# Patient Record
Sex: Male | Born: 1950 | Race: Black or African American | Hispanic: No | Marital: Married | Smoking: Never smoker
Health system: Southern US, Community
[De-identification: ages and names within clinical notes are randomized; demographics above are authoritative.]

## PROBLEM LIST (undated history)

## (undated) DIAGNOSIS — I10 Essential (primary) hypertension: Secondary | ICD-10-CM

## (undated) DIAGNOSIS — I499 Cardiac arrhythmia, unspecified: Secondary | ICD-10-CM

## (undated) DIAGNOSIS — E7800 Pure hypercholesterolemia, unspecified: Secondary | ICD-10-CM

## (undated) DIAGNOSIS — E119 Type 2 diabetes mellitus without complications: Secondary | ICD-10-CM

## (undated) HISTORY — PX: OTHER SURGICAL HISTORY: SHX169

---

## 2011-08-15 ENCOUNTER — Ambulatory Visit: Payer: Self-pay | Admitting: Internal Medicine

## 2013-05-30 ENCOUNTER — Ambulatory Visit: Payer: Self-pay | Admitting: Unknown Physician Specialty

## 2013-07-28 ENCOUNTER — Ambulatory Visit: Payer: Self-pay | Admitting: Unknown Physician Specialty

## 2013-07-28 LAB — BASIC METABOLIC PANEL
Anion Gap: 5 — ABNORMAL LOW (ref 7–16)
BUN: 17 mg/dL (ref 7–18)
Calcium, Total: 9.2 mg/dL (ref 8.5–10.1)
Chloride: 106 mmol/L (ref 98–107)
Co2: 28 mmol/L (ref 21–32)
Creatinine: 1.11 mg/dL (ref 0.60–1.30)
EGFR (Non-African Amer.): >60
Glucose: 99 mg/dL (ref 65–99)
Osmolality: 279 (ref 275–301)
POTASSIUM: 4 mmol/L (ref 3.5–5.1)
SODIUM: 139 mmol/L (ref 136–145)

## 2013-08-12 ENCOUNTER — Ambulatory Visit: Payer: Self-pay | Admitting: Unknown Physician Specialty

## 2013-08-13 LAB — PATHOLOGY REPORT

## 2014-09-05 NOTE — Op Note (Signed)
PATIENT NAME:  Jordan Santana, Jordan Santana MR#:  [id number] DATE OF BIRTH:  [birth year]  DATE OF PROCEDURE:  08/12/2013  DATE OF DICTATION: 08/22/2013   PREOPERATIVE DIAGNOSIS: Right tongue mass.  POSTOPERATIVE DIAGNOSIS: Right tongue mass.  ATTENDING SURGEON: Roena Malady, MD  OPERATION PERFORMED: Excision of intra-tongue mass.   OPERATIVE FINDINGS: Approximately 1.5 x 2 cm firm mass within just right midline of the tongue.   DESCRIPTION OF PROCEDURE: [name] was identified in the holding area, taken to the operating room and placed in the supine position. After general nasotracheal anesthesia, the table was turned 90 degrees, and the patient was draped in the usual fashion for oral surgery. A Molt retractor was then placed between the teeth, and a stitch was placed in the tongue for retraction. There was a firm mass just to the right of midline within the tongue. A local anesthetic of 2% Marcaine with 1:200,000 units of epinephrine was used to inject the inferior aspect of the tongue. A total of 3 mL was used. With the tongue retracted laterally, incision was made just to the right of midline on the ventral surface of the tongue. Dissection using the microbipolars was carried down to the mass, and using blunt dissection and the microbipolar, this mass was removed in its entirety with a small cuff of muscle around this as well. With the mass removed in its entirety, any bleeding points were cauterized using the microbipolar. The mucosal layers were then reapproximated using 4-0 Vicryl suture. The patient was then returned to anesthesia, where he was extubated in the operating room and taken to the recovery room in stable condition.   CULTURES: None.   SPECIMENS: Tongue mass.   ESTIMATED BLOOD LOSS: Less than 5 mL.    ____________________________ Roena Malady, MD ctm:lb D: 08/22/2013 09:19:39 ET T: 08/22/2013 09:26:16 ET JOB#: [id number]  cc: Roena Malady, MD, <Dictator> Roena Malady  MD ELECTRONICALLY SIGNED 09/10/2013 8:23

## 2015-03-30 IMAGING — CT CT NECK WITH CONTRAST
4 of 5 series · 15 of 33 positions shown, 17 images · IV contrast (agent unspecified)
Comparison: None.

CLINICAL DATA: Right anterior tongue mass/anterior neck mass for 3
weeks.

EXAM:
CT NECK WITH CONTRAST
TECHNIQUE: Multidetector CT imaging of the neck was performed using the
standard protocol following the bolus administration of intravenous
contrast.
CONTRAST:  75 mL Hsovue-HMM

[Series 2: axial neck · axial · 0.49mm/px · z∈[-135,-23]mm · 3 of 112 slices shown]
[im 28/112  bone]
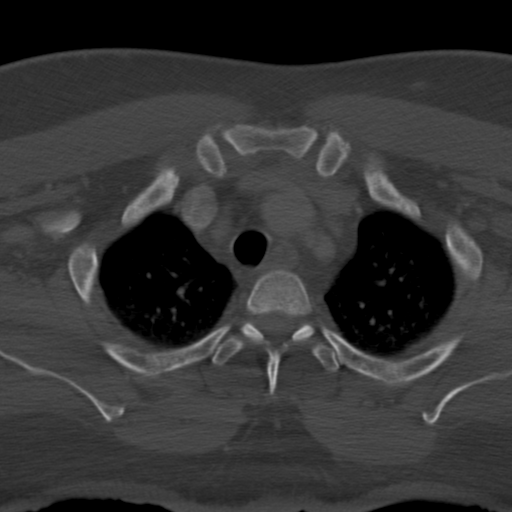
[im 56/112  bone]
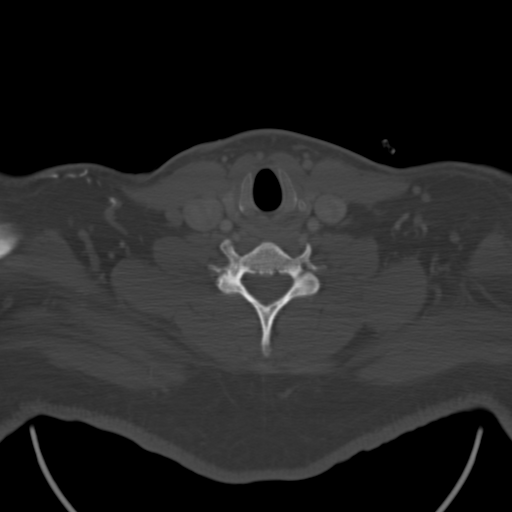
[im 84/112  bone]
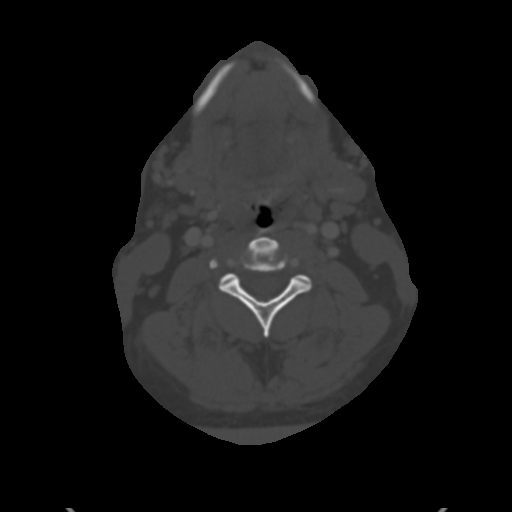

[Series 4: sag neck · sagittal · 0.45mm/px · 5 of 126 slices shown, 6 images]
[im 42/126  bone]
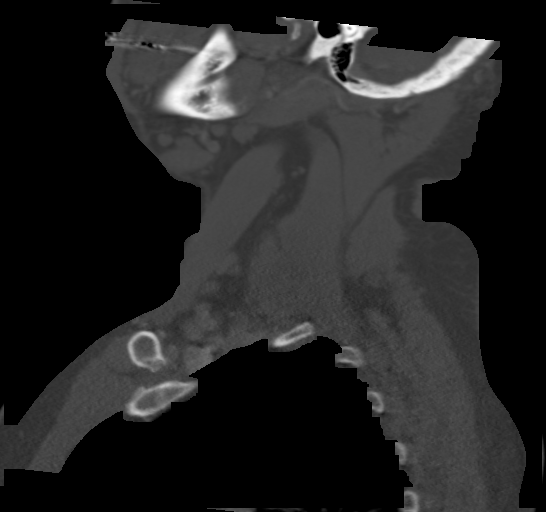
[im 53/126  bone]
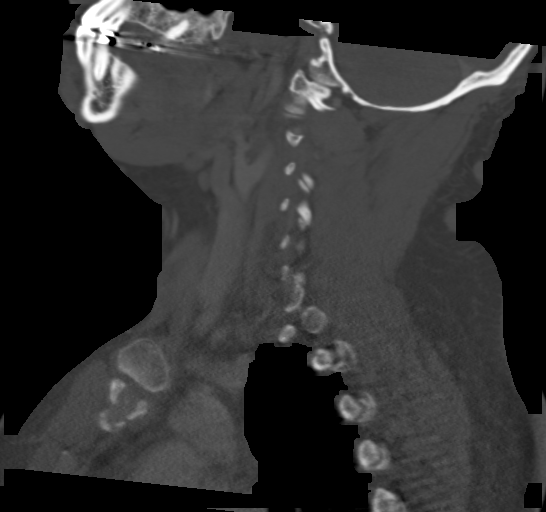
[im 63/126  soft-tissue]
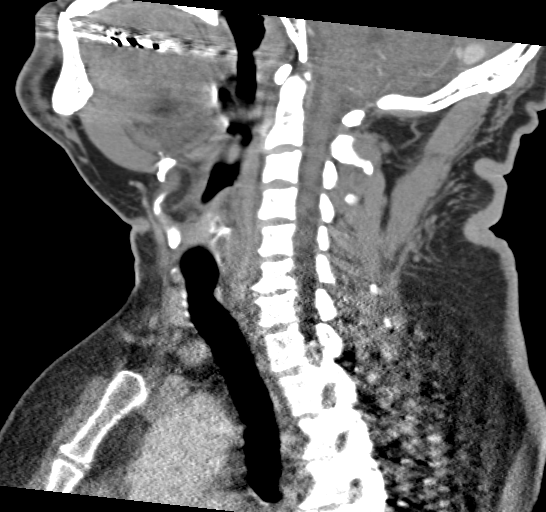
[im 63/126  bone]
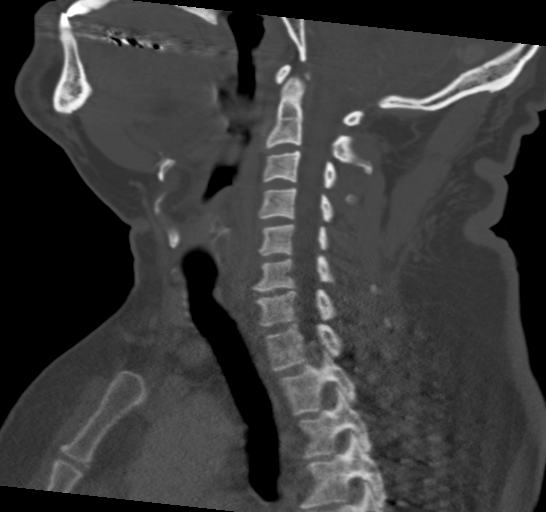
[im 73/126  bone]
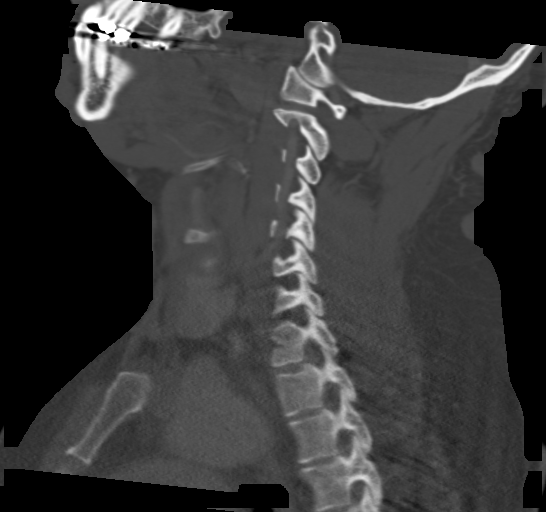
[im 84/126  bone]
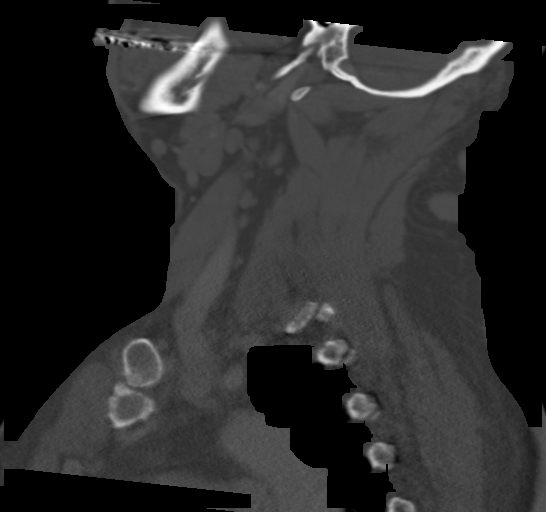

[Series 5: cor neck · coronal · 0.46mm/px · 3 of 110 slices shown]
[im 22/110  bone]
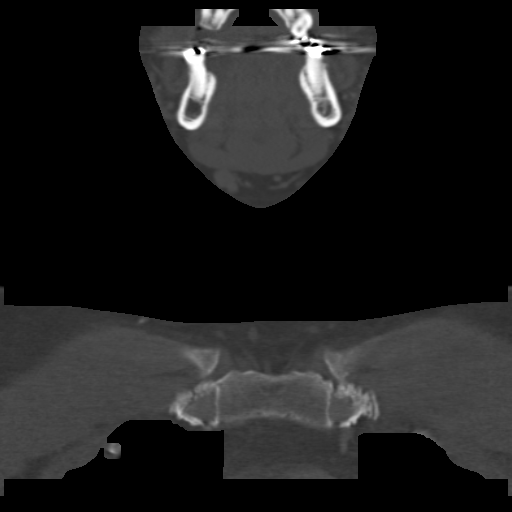
[im 44/110  bone]
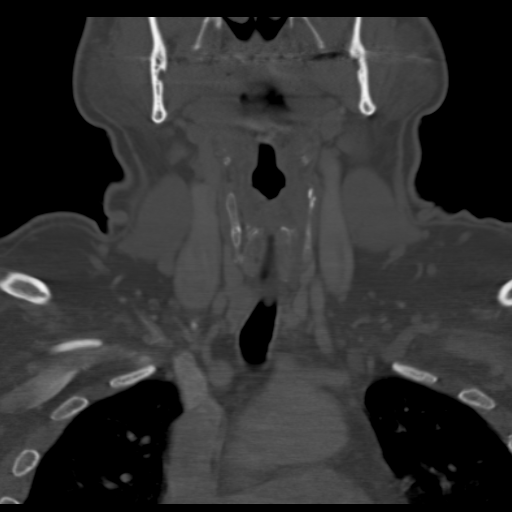
[im 66/110  bone]
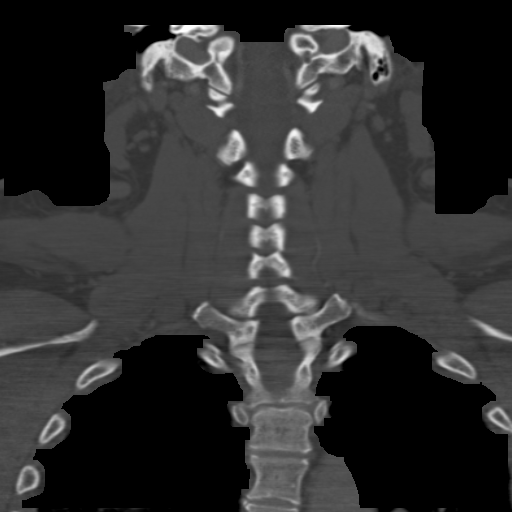

[Series 6: ax oropharynx · axial · 0.50mm/px · z∈[-162,-25]mm · 4 of 116 slices shown, 5 images]
[im 24/116  soft-tissue]
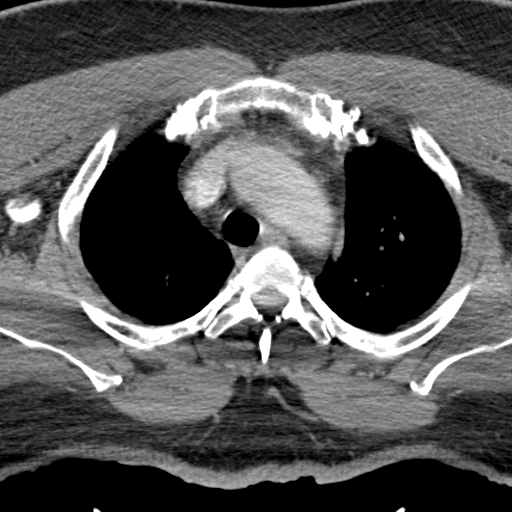
[im 24/116  bone]
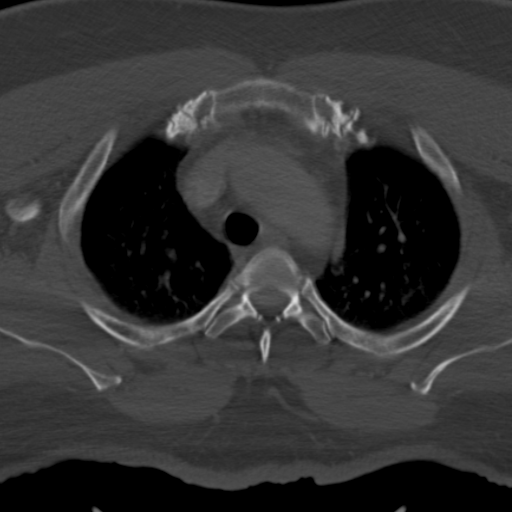
[im 47/116  bone]
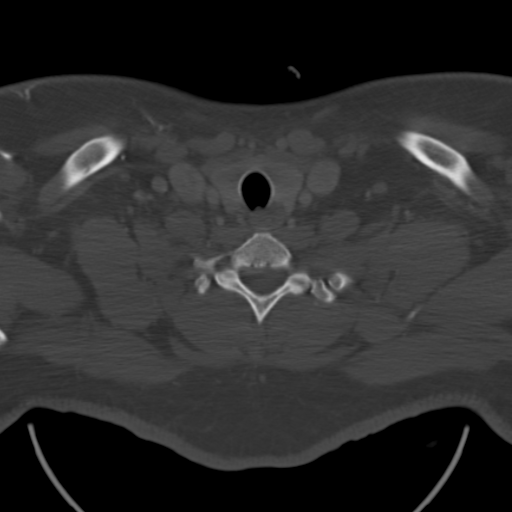
[im 70/116  bone]
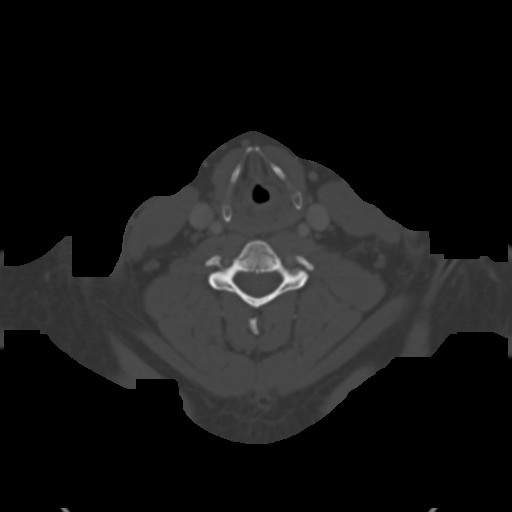
[im 93/116  bone]
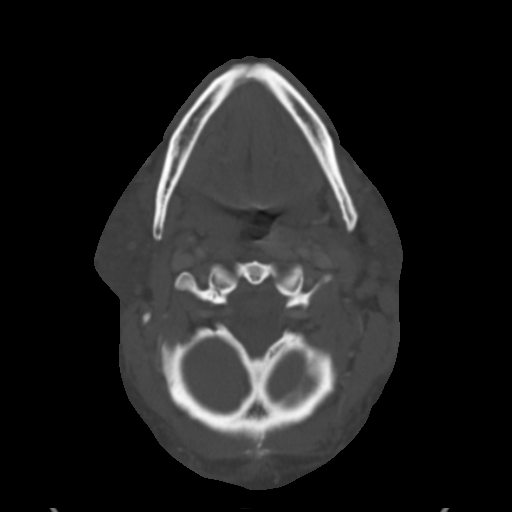

[15 of 33 positions shown; findings below may reference images not displayed]

FINDINGS: The visualized portion of the brain is unremarkable. Visualized
paranasal sinuses and mastoid air cells are clear.

Streak artifact is present from dental amalgam. The nasopharynx,
oral cavity, oropharynx, and larynx are unremarkable. The parotid
and submandibular glands are unremarkable. Thyroid is unremarkable.

Deep to a marker placed on the anterior right neck in the area of
clinical concern is a mildly enlarged right submental lymph node
measuring 1.6 x 1.0 cm. The lymph node maintains a fatty hilum.
Multiple subcentimeter cervical lymph nodes are seen bilaterally,
without other enlarged lymph nodes identified in the neck. The
visualized lung apices are clear. Normal intravascular enhancement
is seen. Small Schmorl's nodes are noted at C6-7, where there is
also mild to moderate disc space narrowing with vacuum disc
phenomenon and endplate spurring.
IMPRESSION: A mildly enlarged right submental lymph node is present in the area
of clinical concern. The lymph node maintains a fatty hilum and is
likely reactive. No primary neck mass or other enlarged cervical
lymph nodes identified.

## 2017-11-05 ENCOUNTER — Encounter: Payer: Self-pay | Admitting: *Deleted

## 2017-11-06 ENCOUNTER — Other Ambulatory Visit: Payer: Self-pay

## 2017-11-06 ENCOUNTER — Encounter
Admission: RE | Disposition: A | Discharge status: Home / Self Care | Payer: Self-pay | Source: Ambulatory Visit | Attending: Internal Medicine

## 2017-11-06 ENCOUNTER — Ambulatory Visit: Payer: Medicare Other

## 2017-11-06 ENCOUNTER — Ambulatory Visit
Admission: RE | Admit: 2017-11-06 | Discharge: 2017-11-06 | Disposition: A | Discharge status: Home / Self Care | Payer: Medicare Other | Source: Ambulatory Visit | Attending: Internal Medicine | Admitting: Internal Medicine

## 2017-11-06 ENCOUNTER — Encounter: Payer: Self-pay | Admitting: *Deleted

## 2017-11-06 DIAGNOSIS — Z79899 Other long term (current) drug therapy: Secondary | ICD-10-CM | POA: Diagnosis not present

## 2017-11-06 DIAGNOSIS — D122 Benign neoplasm of ascending colon: Secondary | ICD-10-CM | POA: Insufficient documentation

## 2017-11-06 DIAGNOSIS — E119 Type 2 diabetes mellitus without complications: Secondary | ICD-10-CM | POA: Diagnosis not present

## 2017-11-06 DIAGNOSIS — Z7982 Long term (current) use of aspirin: Secondary | ICD-10-CM | POA: Diagnosis not present

## 2017-11-06 DIAGNOSIS — Z1211 Encounter for screening for malignant neoplasm of colon: Secondary | ICD-10-CM | POA: Insufficient documentation

## 2017-11-06 DIAGNOSIS — K640 First degree hemorrhoids: Secondary | ICD-10-CM | POA: Diagnosis not present

## 2017-11-06 DIAGNOSIS — Z791 Long term (current) use of non-steroidal anti-inflammatories (NSAID): Secondary | ICD-10-CM | POA: Diagnosis not present

## 2017-11-06 DIAGNOSIS — K5730 Diverticulosis of large intestine without perforation or abscess without bleeding: Secondary | ICD-10-CM | POA: Insufficient documentation

## 2017-11-06 DIAGNOSIS — Z79891 Long term (current) use of opiate analgesic: Secondary | ICD-10-CM | POA: Insufficient documentation

## 2017-11-06 HISTORY — DX: Pure hypercholesterolemia, unspecified: E78.00

## 2017-11-06 HISTORY — DX: Cardiac arrhythmia, unspecified: I49.9

## 2017-11-06 HISTORY — PX: COLONOSCOPY WITH PROPOFOL: SHX5780

## 2017-11-06 HISTORY — DX: Type 2 diabetes mellitus without complications: E11.9

## 2017-11-06 LAB — GLUCOSE, CAPILLARY: Glucose-Capillary: 178 mg/dL — ABNORMAL HIGH (ref 70–99)

## 2017-11-06 SURGERY — COLONOSCOPY WITH PROPOFOL
Anesthesia: General

## 2017-11-06 MED ORDER — LIDOCAINE HCL (CARDIAC) PF 100 MG/5ML IV SOSY
PREFILLED_SYRINGE | INTRAVENOUS | Status: DC | PRN
  Administered 2017-11-06: 80 mg via INTRAVENOUS

## 2017-11-06 MED ORDER — PROPOFOL 500 MG/50ML IV EMUL
INTRAVENOUS | Status: DC | PRN
  Administered 2017-11-06: 150 ug/kg/min via INTRAVENOUS

## 2017-11-06 MED ORDER — PROPOFOL 10 MG/ML IV BOLUS
INTRAVENOUS | Status: DC | PRN
  Administered 2017-11-06 (×2): 20 mg via INTRAVENOUS
  Administered 2017-11-06: 30 mg via INTRAVENOUS

## 2017-11-06 MED ORDER — PROPOFOL 10 MG/ML IV BOLUS
INTRAVENOUS | Status: AC
  Filled 2017-11-06: qty 40

## 2017-11-06 MED ORDER — GLYCOPYRROLATE 0.2 MG/ML IJ SOLN
INTRAMUSCULAR | Status: DC | PRN
  Administered 2017-11-06: 0.1 mg via INTRAVENOUS

## 2017-11-06 MED ORDER — LIDOCAINE HCL (PF) 2 % IJ SOLN
INTRAMUSCULAR | Status: AC
  Filled 2017-11-06: qty 10

## 2017-11-06 MED ORDER — SODIUM CHLORIDE 0.9 % IV SOLN
INTRAVENOUS | Status: DC
  Administered 2017-11-06: 08:00:00 via INTRAVENOUS

## 2017-11-06 NOTE — Interval H&P Note (Signed)
History and Physical Interval Note:  11/06/2017 7:58 AM  Jordan Santana  has presented today for surgery, with the diagnosis of CCA SCREEN  The various methods of treatment have been discussed with the patient and family. After consideration of risks, benefits and other options for treatment, the patient has consented to  Procedure(s): COLONOSCOPY WITH PROPOFOL (N/A) as a surgical intervention .  The patient's history has been reviewed, patient examined, no change in status, stable for surgery.  I have reviewed the patient's chart and labs.  Questions were answered to the patient's satisfaction.     [place], [place]

## 2017-11-06 NOTE — Anesthesia Post-op Follow-up Note (Signed)
Anesthesia QCDR form completed.        

## 2017-11-06 NOTE — Op Note (Signed)
[facility] Gastroenterology Patient Name: Jordan Santana Procedure Date: 11/06/2017 8:00 AM MRN: [id number] Account #: [id number] Date of Birth: 1950-05-27 Admit Type: Outpatient Age: 67 Room: [facility] ENDO ROOM 2 Gender: Male Note Status: Finalized Procedure:            Colonoscopy Indications:          Screening for colorectal malignant neoplasm Providers:            Benay Pike. Alice Reichert MD, MD Referring MD:         No Local Md, MD (Referring MD) Medicines:            Propofol per Anesthesia Complications:        No immediate complications. Procedure:            Pre-Anesthesia Assessment:                       - The risks and benefits of the procedure and the                        sedation options and risks were discussed with the                        patient. All questions were answered and informed                        consent was obtained.                       - Patient identification and proposed procedure were                        verified prior to the procedure by the nurse. The                        procedure was verified in the procedure room.                       - ASA Grade Assessment: III - A patient with severe                        systemic disease.                       - After reviewing the risks and benefits, the patient                        was deemed in satisfactory condition to undergo the                        procedure.                       After obtaining informed consent, the colonoscope was                        passed under direct vision. Throughout the procedure,                        the patient's blood pressure, pulse, and oxygen  saturations were monitored continuously. The                        Colonoscope was introduced through the anus and                        advanced to the the cecum, identified by appendiceal                        orifice and ileocecal valve. The colonoscopy was         somewhat difficult due to significant looping.                        Successful completion of the procedure was aided by                        withdrawing and reinserting the scope. The patient                        tolerated the procedure well. The quality of the bowel                        preparation was adequate. The ileocecal valve,                        appendiceal orifice, and rectum were photographed. Findings:      The perianal and digital rectal examinations were normal. Pertinent       negatives include normal sphincter tone and no palpable rectal lesions.      A few small-mouthed diverticula were found in the sigmoid colon.      A 6 mm polyp was found in the proximal ascending colon. The polyp was       semi-pedunculated. The polyp was removed with a cold snare. Resection       and retrieval were complete.      Non-bleeding internal hemorrhoids were found during retroflexion. The       hemorrhoids were Grade I (internal hemorrhoids that do not prolapse).      The exam was otherwise without abnormality. Impression:           - Diverticulosis in the sigmoid colon.                       - One 6 mm polyp in the proximal ascending colon,                        removed with a cold snare. Resected and retrieved.                       - Non-bleeding internal hemorrhoids.                       - The examination was otherwise normal. Recommendation:       - Patient has a contact number available for                        emergencies. The signs and symptoms of potential                        delayed complications were discussed with the  patient.                        Return to normal activities tomorrow. Written discharge                        instructions were provided to the patient.                       - Resume previous diet.                       - Continue present medications.                       - Repeat colonoscopy is recommended for surveillance.                         The colonoscopy date will be determined after pathology                        results from today's exam become available for review.                       - Return to GI office PRN.                       - The findings and recommendations were discussed with                        the patient and their spouse. Procedure Code(s):    --- Professional ---                       [phone], Colonoscopy, flexible; with removal of tumor(s),                        polyp(s), or other lesion(s) by snare technique Diagnosis Code(s):    --- Professional ---                       K57.30, Diverticulosis of large intestine without                        perforation or abscess without bleeding                       D12.2, Benign neoplasm of ascending colon                       K64.0, First degree hemorrhoids                       Z12.11, Encounter for screening for malignant neoplasm                        of colon CPT copyright 2017 [employer]. All rights reserved. The codes documented in this report are preliminary and upon coder review may  be revised to meet current compliance requirements. Efrain Sella MD, MD 11/06/2017 8:38:01 AM This report has been signed electronically. Number of Addenda: 0 Note Initiated On: 11/06/2017 8:00 AM Scope Withdrawal Time: 0 hours 13 minutes 2 seconds  Total Procedure Duration: 0 hours 25 minutes  [facility]

## 2017-11-06 NOTE — Anesthesia Preprocedure Evaluation (Signed)
Anesthesia Evaluation  Patient identified by MRN, date of birth, ID band Patient awake    Reviewed: Allergy & Precautions, H&P , NPO status , Patient's Chart, lab work & pertinent test results  History of Anesthesia Complications Negative for: history of anesthetic complications  Airway Mallampati: III  TM Distance: <3 FB Neck ROM: full    Dental  (+) Chipped, Poor Dentition   Pulmonary  Signs and symptoms suggestive of sleep apnea            Cardiovascular Exercise Tolerance: Good (-) angina(-) Past MI and (-) DOE + dysrhythmias      Neuro/Psych negative neurological ROS  negative psych ROS   GI/Hepatic negative GI ROS, Neg liver ROS,   Endo/Other  diabetes, Type 2  Renal/GU negative Renal ROS  negative genitourinary   Musculoskeletal   Abdominal   Peds  Hematology negative hematology ROS (+)   Anesthesia Other Findings Past Medical History: No date: Diabetes mellitus without complication (HCC) No date: Dysrhythmia No date: Hypercholesteremia  Past Surgical History: No date: ABSESS TONGUE No date: TUMOR ON FINGER     Comment:  REMOVED     Reproductive/Obstetrics negative OB ROS                             Anesthesia Physical Anesthesia Plan  ASA: III  Anesthesia Plan: General   Post-op Pain Management:    Induction: Intravenous  PONV Risk Score and Plan: Propofol infusion and TIVA  Airway Management Planned: Natural Airway and Nasal Cannula  Additional Equipment:   Intra-op Plan:   Post-operative Plan:   Informed Consent: I have reviewed the patients History and Physical, chart, labs and discussed the procedure including the risks, benefits and alternatives for the proposed anesthesia with the patient or authorized representative who has indicated his/her understanding and acceptance.   Dental Advisory Given  Plan Discussed with: Anesthesiologist, CRNA and  Surgeon  Anesthesia Plan Comments: (Patient consented for risks of anesthesia including but not limited to:  - adverse reactions to medications - risk of intubation if required - damage to teeth, lips or other oral mucosa - sore throat or hoarseness - Damage to heart, brain, lungs or loss of life  Patient voiced understanding.)        Anesthesia Quick Evaluation

## 2017-11-06 NOTE — Transfer of Care (Signed)
Immediate Anesthesia Transfer of Care Note  Patient: Jordan Santana  Procedure(s) Performed: COLONOSCOPY WITH PROPOFOL (N/A )  Patient Location: PACU  Anesthesia Type:General  Level of Consciousness: awake and drowsy  Airway & Oxygen Therapy: Patient Spontanous Breathing and Patient connected to nasal cannula oxygen  Post-op Assessment: Report given to RN and Post -op Vital signs reviewed and stable  Post vital signs: Reviewed and stable  Last Vitals:  Vitals Value Taken Time  BP 100/84 11/06/2017  8:38 AM  Temp 36.1 C 11/06/2017  8:30 AM  Pulse 88 11/06/2017  8:40 AM  Resp 23 11/06/2017  8:40 AM  SpO2 98 % 11/06/2017  8:40 AM  Vitals shown include unvalidated device data.  Last Pain:  Vitals:   11/06/17 0830  TempSrc: Tympanic  PainSc:          Complications: No apparent anesthesia complications

## 2017-11-06 NOTE — Anesthesia Postprocedure Evaluation (Signed)
Anesthesia Post Note  Patient: Jordan Santana  Procedure(s) Performed: COLONOSCOPY WITH PROPOFOL (N/A )  Patient location during evaluation: Endoscopy Anesthesia Type: General Level of consciousness: awake and alert Pain management: pain level controlled Vital Signs Assessment: post-procedure vital signs reviewed and stable Respiratory status: spontaneous breathing, nonlabored ventilation, respiratory function stable and patient connected to nasal cannula oxygen Cardiovascular status: blood pressure returned to baseline and stable Postop Assessment: no apparent nausea or vomiting Anesthetic complications: no     Last Vitals:  Vitals:   11/06/17 0850 11/06/17 0900  BP: 111/84 128/85  Pulse: 71 75  Resp: 18 15  Temp:    SpO2: 100% (!) 74%    Last Pain:  Vitals:   11/06/17 0830  TempSrc: Tympanic  PainSc:                  Precious Haws Piscitello

## 2017-11-06 NOTE — H&P (Signed)
Outpatient short stay form Pre-procedure 11/06/2017 7:57 AM Jordan Santana K. Jordan Santana, M.D.  Primary Physician: Pernell Dupre, M.D.  Reason for visit: Colon cancer screening.  History of present illness: Patient presents for colonoscopy for screening. The patient denies complaints of abdominal pain, significant change in bowel habits, or rectal bleeding.     Current Facility-Administered Medications:  .  0.9 %  sodium chloride infusion, , Intravenous, Continuous, Jordan Santana, Jordan Pike, MD, Last Rate: 20 mL/hr at 11/06/17 9179  Medications Prior to Admission  Medication Sig Dispense Refill Last Dose  . aspirin EC 81 MG tablet Take 81 mg by mouth daily.   11/05/2017 at 0500  . Canagliflozin-metFORMIN HCl ER (INVOKAMET XR) [phone] MG TB24 Take 150-1,000 mg by mouth.   11/05/2017 at 0500  . ibuprofen (ADVIL,MOTRIN) 800 MG tablet Take 800 mg by mouth every 8 (eight) hours as needed.   Past Week at Unknown time  . irbesartan (AVAPRO) 150 MG tablet Take 150 mg by mouth daily.   11/05/2017 at 0500  . tadalafil (ADCIRCA/CIALIS) 20 MG tablet Take 20 mg by mouth daily as needed for erectile dysfunction.   Past Week at Unknown time  . traMADol (ULTRAM) 50 MG tablet Take 50 mg by mouth every 6 (six) hours as needed.   11/05/2017 at 0500  . polyethylene glycol-electrolytes (NULYTELY/GOLYTELY) 420 g solution Take 4,000 mLs by mouth once.   Completed Course at Unknown time     Allergies  Allergen Reactions  . Bee Venom     HONEY BEE  . Biaxin [Clarithromycin] Rash     Past Medical History:  Diagnosis Date  . Diabetes mellitus without complication ([place])   . Dysrhythmia   . Hypercholesteremia     Review of systems:  Otherwise negative.    Physical Exam  Gen: Alert, oriented. Appears stated age.  HEENT: [place]/AT. PERRLA. Lungs: CTA, no wheezes. CV: RR nl S1, S2. Abd: soft, benign, no masses. BS+ Ext: No edema. Pulses 2+    Planned procedures: Proceed with colonoscopy. The patient understands the  nature of the planned procedure, indications, risks, alternatives and potential complications including but not limited to bleeding, infection, perforation, damage to internal organs and possible oversedation/side effects from anesthesia. The patient agrees and gives consent to proceed.  Please refer to procedure notes for findings, recommendations and patient disposition/instructions.     Jordan Santana K. Jordan Santana, M.D. Gastroenterology 11/06/2017  7:57 AM

## 2017-11-07 LAB — SURGICAL PATHOLOGY

## 2017-11-09 ENCOUNTER — Encounter: Payer: Self-pay | Admitting: Internal Medicine

## 2018-03-15 DIAGNOSIS — E119 Type 2 diabetes mellitus without complications: Secondary | ICD-10-CM

## 2018-03-15 DIAGNOSIS — I10 Essential (primary) hypertension: Secondary | ICD-10-CM | POA: Diagnosis present

## 2021-02-03 ENCOUNTER — Encounter: Payer: Self-pay | Admitting: *Deleted

## 2021-02-04 ENCOUNTER — Encounter
Admission: RE | Disposition: A | Discharge status: Home / Self Care | Payer: Self-pay | Source: Home / Self Care | Attending: Gastroenterology

## 2021-02-04 ENCOUNTER — Ambulatory Visit: Payer: Medicare Other | Admitting: Anesthesiology

## 2021-02-04 ENCOUNTER — Ambulatory Visit
Admission: RE | Admit: 2021-02-04 | Discharge: 2021-02-04 | Disposition: A | Discharge status: Home / Self Care | Payer: Medicare Other | Attending: Gastroenterology | Admitting: Gastroenterology

## 2021-02-04 ENCOUNTER — Other Ambulatory Visit: Payer: Self-pay

## 2021-02-04 ENCOUNTER — Encounter: Payer: Self-pay | Admitting: *Deleted

## 2021-02-04 DIAGNOSIS — E119 Type 2 diabetes mellitus without complications: Secondary | ICD-10-CM | POA: Insufficient documentation

## 2021-02-04 DIAGNOSIS — Z79899 Other long term (current) drug therapy: Secondary | ICD-10-CM | POA: Insufficient documentation

## 2021-02-04 DIAGNOSIS — Z881 Allergy status to other antibiotic agents status: Secondary | ICD-10-CM | POA: Insufficient documentation

## 2021-02-04 DIAGNOSIS — Z7982 Long term (current) use of aspirin: Secondary | ICD-10-CM | POA: Diagnosis not present

## 2021-02-04 DIAGNOSIS — D175 Benign lipomatous neoplasm of intra-abdominal organs: Secondary | ICD-10-CM | POA: Insufficient documentation

## 2021-02-04 DIAGNOSIS — Z7984 Long term (current) use of oral hypoglycemic drugs: Secondary | ICD-10-CM | POA: Diagnosis not present

## 2021-02-04 DIAGNOSIS — R195 Other fecal abnormalities: Secondary | ICD-10-CM | POA: Diagnosis present

## 2021-02-04 DIAGNOSIS — K5730 Diverticulosis of large intestine without perforation or abscess without bleeding: Secondary | ICD-10-CM | POA: Diagnosis not present

## 2021-02-04 DIAGNOSIS — E669 Obesity, unspecified: Secondary | ICD-10-CM | POA: Insufficient documentation

## 2021-02-04 DIAGNOSIS — D123 Benign neoplasm of transverse colon: Secondary | ICD-10-CM | POA: Insufficient documentation

## 2021-02-04 HISTORY — DX: Essential (primary) hypertension: I10

## 2021-02-04 HISTORY — PX: COLONOSCOPY WITH PROPOFOL: SHX5780

## 2021-02-04 LAB — GLUCOSE, CAPILLARY: Glucose-Capillary: 113 mg/dL — ABNORMAL HIGH (ref 70–99)

## 2021-02-04 SURGERY — COLONOSCOPY WITH PROPOFOL
Anesthesia: General

## 2021-02-04 MED ORDER — PROPOFOL 500 MG/50ML IV EMUL
INTRAVENOUS | Status: AC
  Filled 2021-02-04: qty 50

## 2021-02-04 MED ORDER — SODIUM CHLORIDE 0.9 % IV SOLN: INTRAVENOUS | Status: DC

## 2021-02-04 MED ORDER — LIDOCAINE HCL (PF) 2 % IJ SOLN
INTRAMUSCULAR | Status: AC
  Filled 2021-02-04: qty 5

## 2021-02-04 MED ORDER — LIDOCAINE HCL (CARDIAC) PF 100 MG/5ML IV SOSY
PREFILLED_SYRINGE | INTRAVENOUS | Status: DC | PRN
  Administered 2021-02-04: 100 mg via INTRAVENOUS

## 2021-02-04 MED ORDER — PROPOFOL 500 MG/50ML IV EMUL
INTRAVENOUS | Status: AC
  Filled 2021-02-04: qty 100

## 2021-02-04 MED ORDER — PROPOFOL 500 MG/50ML IV EMUL
INTRAVENOUS | Status: DC | PRN
  Administered 2021-02-04: 140 ug/kg/min via INTRAVENOUS

## 2021-02-04 MED ORDER — PROPOFOL 10 MG/ML IV BOLUS
INTRAVENOUS | Status: DC | PRN
  Administered 2021-02-04: 70 mg via INTRAVENOUS

## 2021-02-04 MED ORDER — DEXMEDETOMIDINE HCL IN NACL 200 MCG/50ML IV SOLN
INTRAVENOUS | Status: AC
  Filled 2021-02-04: qty 50

## 2021-02-04 MED ORDER — GLYCOPYRROLATE 0.2 MG/ML IJ SOLN
INTRAMUSCULAR | Status: AC
  Filled 2021-02-04: qty 1

## 2021-02-04 NOTE — Anesthesia Preprocedure Evaluation (Signed)
Anesthesia Evaluation  Patient identified by MRN, date of birth, ID band Patient awake    Reviewed: Allergy & Precautions, H&P , NPO status , Patient's Chart, lab work & pertinent test results  History of Anesthesia Complications Negative for: history of anesthetic complications  Airway Mallampati: III  TM Distance: <3 FB Neck ROM: full    Dental  (+) Missing,    Pulmonary  Signs and symptoms suggestive of sleep apnea     Pulmonary exam normal        Cardiovascular Exercise Tolerance: Good hypertension, Pt. on medications (-) angina(-) Past MI and (-) DOE Normal cardiovascular exam+ dysrhythmias  Rhythm:Regular     Neuro/Psych negative neurological ROS  negative psych ROS   GI/Hepatic negative GI ROS, Neg liver ROS,   Endo/Other  diabetes, Type 2  Renal/GU negative Renal ROS  negative genitourinary   Musculoskeletal   Abdominal (+) + obese,   Peds  Hematology negative hematology ROS (+)   Anesthesia Other Findings Past Medical History: No date: Diabetes mellitus without complication (HCC) No date: Dysrhythmia No date: Hypercholesteremia  Past Surgical History: No date: ABSESS TONGUE No date: TUMOR ON FINGER     Comment:  REMOVED     Reproductive/Obstetrics negative OB ROS                             Anesthesia Physical  Anesthesia Plan  ASA: III  Anesthesia Plan: General   Post-op Pain Management:    Induction: Intravenous  PONV Risk Score and Plan: Propofol infusion and TIVA  Airway Management Planned: Natural Airway and Nasal Cannula  Additional Equipment:   Intra-op Plan:   Post-operative Plan:   Informed Consent: I have reviewed the patients History and Physical, chart, labs and discussed the procedure including the risks, benefits and alternatives for the proposed anesthesia with the patient or authorized representative who has indicated his/her  understanding and acceptance.     Dental Advisory Given  Plan Discussed with: Anesthesiologist, CRNA and Surgeon  Anesthesia Plan Comments: (Patient consented for risks of anesthesia including but not limited to:  - adverse reactions to medications - risk of intubation if required - damage to teeth, lips or other oral mucosa - sore throat or hoarseness - Damage to heart, brain, lungs or loss of life  Patient voiced understanding.)        Anesthesia Quick Evaluation

## 2021-02-04 NOTE — Interval H&P Note (Signed)
History and Physical Interval Note:  02/04/2021 9:10 AM  Jordan Santana  has presented today for surgery, with the diagnosis of + POSITIVE FIT HX COLON POLYP.  The various methods of treatment have been discussed with the patient and family. After consideration of risks, benefits and other options for treatment, the patient has consented to  Procedure(s) with comments: COLONOSCOPY WITH PROPOFOL (N/A) - DM as a surgical intervention.  The patient's history has been reviewed, patient examined, no change in status, stable for surgery.  I have reviewed the patient's chart and labs.  Questions were answered to the patient's satisfaction.     Lesly Rubenstein  Ok to proceed with colonoscopy

## 2021-02-04 NOTE — Op Note (Signed)
[facility] Patient Name: Jordan Santana Procedure Date: 02/04/2021 9:01 AM MRN: [id number] Account #: [id number] Date of Birth: [birth year] Admit Type: Outpatient Age: 70 Room: [facility] ENDO ROOM 3 Gender: Male Note Status: Finalized Instrument Name: Park Meo [id number] Procedure:             Colonoscopy Indications:           Positive fecal immunochemical test Providers:             Andrey Farmer MD, MD Medicines:             Monitored Anesthesia Care Complications:         No immediate complications. Estimated blood loss:                         Minimal. Procedure:             Pre-Anesthesia Assessment:                        - Prior to the procedure, a History and Physical was                         performed, and patient medications and allergies were                         reviewed. The patient is competent. The risks and                         benefits of the procedure and the sedation options and                         risks were discussed with the patient. All questions                         were answered and informed consent was obtained.                         Patient identification and proposed procedure were                         verified by the physician, the nurse, the anesthetist                         and the technician in the endoscopy suite. Mental                         Status Examination: alert and oriented. Airway                         Examination: normal oropharyngeal airway and neck                         mobility. Respiratory Examination: clear to                         auscultation. CV Examination: normal. Prophylactic                         Antibiotics: The patient does not require prophylactic  antibiotics. Prior Anticoagulants: The patient has                         taken no previous anticoagulant or antiplatelet                         agents. ASA Grade Assessment: III - A patient with                          severe systemic disease. After reviewing the risks and                         benefits, the patient was deemed in satisfactory                         condition to undergo the procedure. The anesthesia                         plan was to use monitored anesthesia care (MAC).                         Immediately prior to administration of medications,                         the patient was re-assessed for adequacy to receive                         sedatives. The heart rate, respiratory rate, oxygen                         saturations, blood pressure, adequacy of pulmonary                         ventilation, and response to care were monitored                         throughout the procedure. The physical status of the                         patient was re-assessed after the procedure.                        After obtaining informed consent, the colonoscope was                         passed under direct vision. Throughout the procedure,                         the patient's blood pressure, pulse, and oxygen                         saturations were monitored continuously. The                         Colonoscope was introduced through the anus and                         advanced to the the cecum, identified by appendiceal  orifice and ileocecal valve. The colonoscopy was                         technically difficult and complex due to significant                         looping. Successful completion of the procedure was                         aided by changing the patient to a supine position and                         using manual pressure. The patient tolerated the                         procedure well. The quality of the bowel preparation                         was good. Findings:      The perianal and digital rectal examinations were normal.      A 5 mm polyp was found in the ascending colon. The polyp was       semi-pedunculated.  The polyp was removed with a cold snare. Resection       was complete, but the polyp tissue was not retrieved. Estimated blood       loss was minimal.      A 11 mm polyp was found in the hepatic flexure. The polyp was sessile.       The polyp was removed with a hot snare. Resection and retrieval were       complete. Estimated blood loss was minimal.      There was a medium-sized lipoma, at the hepatic flexure. Biopsies were       taken with a cold forceps for histology. Estimated blood loss was       minimal.      A few small-mouthed diverticula were found in the sigmoid colon.      The exam was otherwise without abnormality on direct and retroflexion       views. Impression:            - One 5 mm polyp in the ascending colon, removed with                         a cold snare. Complete resection. Polyp tissue not                         retrieved.                        - One 11 mm polyp at the hepatic flexure, removed with                         a hot snare. Resected and retrieved.                        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 Biopsied.                        - Diverticulosis in the sigmoid colon.                        -  The examination was otherwise normal on direct and                         retroflexion views. Recommendation:        - Discharge patient to home.                        - Resume previous diet.                        - Continue present medications.                        - Await pathology results.                        - Repeat colonoscopy per previous colonoscopy                         recommendations so in two years for surveillance.                        - Return to referring physician as previously                         scheduled. Procedure Code(s):     --- Professional ---                        [id number], Colonoscopy, flexible; with removal of                         tumor(s), polyp(s), or other lesion(s) by snare                          technique                        45380, 61, Colonoscopy, flexible; with biopsy, single                         or multiple Diagnosis Code(s):     --- Professional ---                        K63.5, Polyp of colon                        D17.5, Benign lipomatous neoplasm of intra-abdominal                         organs                        R19.5, Other fecal abnormalities                        K57.30, Diverticulosis of large intestine without                         perforation or abscess without bleeding CPT copyright 2019 [employer]. All rights reserved. The codes documented in this report are preliminary and upon coder review may  be revised to meet current compliance requirements. Lysbeth Galas  Marshel Golubski MD, MD 02/04/2021 10:26:59 AM Number of Addenda: 0 Note Initiated On: 02/04/2021 9:01 AM Scope Withdrawal Time: 0 hours 9 minutes 18 seconds  Total Procedure Duration: 0 hours 55 minutes 16 seconds  Estimated Blood Loss:  Estimated blood loss was minimal.      [facility]

## 2021-02-04 NOTE — Transfer of Care (Signed)
Immediate Anesthesia Transfer of Care Note  Patient: Jordan Santana  Procedure(s) Performed: COLONOSCOPY WITH PROPOFOL  Patient Location: PACU  Anesthesia Type:General  Level of Consciousness: awake, alert  and oriented  Airway & Oxygen Therapy: Patient Spontanous Breathing  Post-op Assessment: Report given to RN and Post -op Vital signs reviewed and stable  Post vital signs: Reviewed and stable  Last Vitals:  Vitals Value Taken Time  BP 111/76 02/04/21 1028  Temp    Pulse 73 02/04/21 1029  Resp 21 02/04/21 1029  SpO2 98 % 02/04/21 1029  Vitals shown include unvalidated device data.  Last Pain:  Vitals:   02/04/21 0823  TempSrc: Temporal  PainSc: 0-No pain         Complications: No notable events documented.

## 2021-02-04 NOTE — H&P (Signed)
Outpatient short stay form Pre-procedure 02/04/2021  Lesly Rubenstein, MD  Primary Physician: [name], MD  Reason for visit:  Positive FIT test  History of present illness:   70 y/o gentleman with history of DM II and obesity here for colonoscopy for positive FIT test. He had a colonoscopy in 2019 with one small TA noted and 5 year f/u recommended. He had an episode of constipation and some possible blood so his PCP ordered the FIT test which came back positive. No blood thinners. No family history of GI malignancies. No significant abdominal surgeries.    Current Facility-Administered Medications:    0.9 %  sodium chloride infusion, , Intravenous, Continuous, Hayzen Lorenson, Hilton Cork, MD, Last Rate: 20 mL/hr at 02/04/21 0841, New Bag at 02/04/21 0841  Medications Prior to Admission  Medication Sig Dispense Refill Last Dose   aspirin EC 81 MG tablet Take 81 mg by mouth daily.   Past Week   Canagliflozin-metFORMIN HCl ER [phone] MG TB24 Take 150-1,000 mg by mouth.   Past Week   ibuprofen (ADVIL,MOTRIN) 800 MG tablet Take 800 mg by mouth every 8 (eight) hours as needed.   Past Week   icosapent Ethyl (VASCEPA) 1 g capsule Take 2 g by mouth 2 (two) times daily.   Past Week   irbesartan (AVAPRO) 150 MG tablet Take 150 mg by mouth daily.   Past Week   pioglitazone (ACTOS) 45 MG tablet Take 45 mg by mouth daily.   Past Week   polyethylene glycol-electrolytes (NULYTELY/GOLYTELY) 420 g solution Take 4,000 mLs by mouth once.   02/03/2021   Semaglutide (RYBELSUS) 14 MG TABS Take 1 tablet by mouth daily. Do not cut, crush or chew   Past Week   traMADol (ULTRAM) 50 MG tablet Take 50 mg by mouth every 6 (six) hours as needed.   Past Week   tadalafil (ADCIRCA/CIALIS) 20 MG tablet Take 20 mg by mouth daily as needed for erectile dysfunction.        Allergies  Allergen Reactions   Bee Venom     HONEY BEE   Biaxin [Clarithromycin] Rash     Past Medical History:  Diagnosis Date    Diabetes mellitus without complication ([place])    Dysrhythmia    Hypercholesteremia    Hypertension     Review of systems:  Otherwise negative.    Physical Exam  Gen: Alert, oriented. Appears stated age.  HEENT: PERRLA. Lungs: No respiratory distress CV: RRR Abd: soft, benign, no masses Ext: No edema    Planned procedures: Proceed with colonoscopy. The patient understands the nature of the planned procedure, indications, risks, alternatives and potential complications including but not limited to bleeding, infection, perforation, damage to internal organs and possible oversedation/side effects from anesthesia. The patient agrees and gives consent to proceed.  Please refer to procedure notes for findings, recommendations and patient disposition/instructions.     Lesly Rubenstein, MD [facility]

## 2021-02-04 NOTE — Progress Notes (Signed)
No clip

## 2021-02-04 NOTE — Anesthesia Postprocedure Evaluation (Signed)
Anesthesia Post Note  Patient: Jordan Santana  Procedure(s) Performed: COLONOSCOPY WITH PROPOFOL  Patient location during evaluation: Endoscopy Anesthesia Type: General Level of consciousness: awake and alert Pain management: pain level controlled Vital Signs Assessment: post-procedure vital signs reviewed and stable Respiratory status: spontaneous breathing, nonlabored ventilation and respiratory function stable Cardiovascular status: blood pressure returned to baseline and stable Postop Assessment: no apparent nausea or vomiting Anesthetic complications: no   No notable events documented.   Last Vitals:  Vitals:   02/04/21 1036 02/04/21 1046  BP: (!) 130/91 (!) 145/81  Pulse:    Resp:    Temp:    SpO2:      Last Pain:  Vitals:   02/04/21 1046  TempSrc:   PainSc: 0-No pain                 [place] Ouch

## 2021-02-07 LAB — SURGICAL PATHOLOGY

## 2022-03-14 ENCOUNTER — Inpatient Hospital Stay
Admission: EM | Admit: 2022-03-14 | Discharge: 2022-03-20 | DRG: 683 | Disposition: A | Discharge status: Home / Self Care | Payer: Medicare Other | Attending: Internal Medicine | Admitting: Internal Medicine

## 2022-03-14 ENCOUNTER — Other Ambulatory Visit: Payer: Self-pay

## 2022-03-14 DIAGNOSIS — Z6841 Body Mass Index (BMI) 40.0 and over, adult: Secondary | ICD-10-CM

## 2022-03-14 DIAGNOSIS — Z79899 Other long term (current) drug therapy: Secondary | ICD-10-CM

## 2022-03-14 DIAGNOSIS — R899 Unspecified abnormal finding in specimens from other organs, systems and tissues: Secondary | ICD-10-CM | POA: Diagnosis present

## 2022-03-14 DIAGNOSIS — N179 Acute kidney failure, unspecified: Secondary | ICD-10-CM | POA: Diagnosis not present

## 2022-03-14 DIAGNOSIS — E669 Obesity, unspecified: Secondary | ICD-10-CM | POA: Diagnosis present

## 2022-03-14 DIAGNOSIS — E1165 Type 2 diabetes mellitus with hyperglycemia: Secondary | ICD-10-CM | POA: Diagnosis not present

## 2022-03-14 DIAGNOSIS — R748 Abnormal levels of other serum enzymes: Secondary | ICD-10-CM

## 2022-03-14 DIAGNOSIS — I10 Essential (primary) hypertension: Secondary | ICD-10-CM | POA: Diagnosis present

## 2022-03-14 DIAGNOSIS — E118 Type 2 diabetes mellitus with unspecified complications: Secondary | ICD-10-CM | POA: Diagnosis not present

## 2022-03-14 DIAGNOSIS — E7800 Pure hypercholesterolemia, unspecified: Secondary | ICD-10-CM | POA: Diagnosis present

## 2022-03-14 DIAGNOSIS — G8929 Other chronic pain: Secondary | ICD-10-CM | POA: Diagnosis present

## 2022-03-14 DIAGNOSIS — N170 Acute kidney failure with tubular necrosis: Secondary | ICD-10-CM | POA: Diagnosis present

## 2022-03-14 DIAGNOSIS — Z7982 Long term (current) use of aspirin: Secondary | ICD-10-CM | POA: Diagnosis not present

## 2022-03-14 DIAGNOSIS — E119 Type 2 diabetes mellitus without complications: Secondary | ICD-10-CM | POA: Diagnosis present

## 2022-03-14 DIAGNOSIS — Z8616 Personal history of COVID-19: Secondary | ICD-10-CM

## 2022-03-14 DIAGNOSIS — R319 Hematuria, unspecified: Secondary | ICD-10-CM | POA: Diagnosis present

## 2022-03-14 DIAGNOSIS — Z7984 Long term (current) use of oral hypoglycemic drugs: Secondary | ICD-10-CM

## 2022-03-14 DIAGNOSIS — M6282 Rhabdomyolysis: Secondary | ICD-10-CM | POA: Diagnosis present

## 2022-03-14 DIAGNOSIS — R7401 Elevation of levels of liver transaminase levels: Secondary | ICD-10-CM | POA: Diagnosis present

## 2022-03-14 LAB — CBC WITH DIFFERENTIAL/PLATELET
Abs Immature Granulocytes: 0.06 10*3/uL (ref 0.00–0.07)
Basophils Absolute: 0 10*3/uL (ref 0.0–0.1)
Basophils Relative: 0 %
Eosinophils Absolute: 0.1 10*3/uL (ref 0.0–0.5)
Eosinophils Relative: 1 %
HCT: 45.8 % (ref 39.0–52.0)
Hemoglobin: 14.4 g/dL (ref 13.0–17.0)
Immature Granulocytes: 1 %
Lymphocytes Relative: 34 %
Lymphs Abs: 3 10*3/uL (ref 0.7–4.0)
MCH: 29.3 pg (ref 26.0–34.0)
MCHC: 31.4 g/dL (ref 30.0–36.0)
MCV: 93.1 fL (ref 80.0–100.0)
Monocytes Absolute: 0.6 10*3/uL (ref 0.1–1.0)
Monocytes Relative: 7 %
Neutro Abs: 5.1 10*3/uL (ref 1.7–7.7)
Neutrophils Relative %: 57 %
Platelets: 288 10*3/uL (ref 150–400)
RBC: 4.92 MIL/uL (ref 4.22–5.81)
RDW: 13.7 % (ref 11.5–15.5)
WBC: 8.9 10*3/uL (ref 4.0–10.5)
nRBC: 0 % (ref 0.0–0.2)

## 2022-03-14 LAB — URINALYSIS, ROUTINE W REFLEX MICROSCOPIC
Bilirubin Urine: NEGATIVE
Glucose, UA: >=500 mg/dL — AB
Ketones, ur: NEGATIVE mg/dL
Leukocytes,Ua: NEGATIVE
Nitrite: NEGATIVE
Protein, ur: 100 mg/dL — AB
Specific Gravity, Urine: 1.022 (ref 1.005–1.030)
pH: 5 (ref 5.0–8.0)

## 2022-03-14 LAB — COMPREHENSIVE METABOLIC PANEL
ALT: 208 U/L — ABNORMAL HIGH (ref 0–44)
AST: 660 U/L — ABNORMAL HIGH (ref 15–41)
Albumin: 4.2 g/dL (ref 3.5–5.0)
Alkaline Phosphatase: 47 U/L (ref 38–126)
Anion gap: 8 (ref 5–15)
BUN: 31 mg/dL — ABNORMAL HIGH (ref 8–23)
CO2: 29 mmol/L (ref 22–32)
Calcium: 9.3 mg/dL (ref 8.9–10.3)
Chloride: 101 mmol/L (ref 98–111)
Creatinine, Ser: 1.3 mg/dL — ABNORMAL HIGH (ref 0.61–1.24)
GFR, Estimated: 59 mL/min — ABNORMAL LOW (ref >60)
Glucose, Bld: 118 mg/dL — ABNORMAL HIGH (ref 70–99)
Potassium: 4.3 mmol/L (ref 3.5–5.1)
Sodium: 138 mmol/L (ref 135–145)
Total Bilirubin: 0.7 mg/dL (ref 0.3–1.2)
Total Protein: 7.3 g/dL (ref 6.5–8.1)

## 2022-03-14 LAB — CK: Total CK: >50000 U/L — ABNORMAL HIGH (ref 49–397)

## 2022-03-14 MED ORDER — HEPARIN SODIUM (PORCINE) 5000 UNIT/ML IJ SOLN
5000.0000 [IU] | Freq: Three times a day (TID) | INTRAMUSCULAR | Status: DC
  Administered 2022-03-14 – 2022-03-20 (×17): 5000 [IU] via SUBCUTANEOUS
  Filled 2022-03-14 (×16): qty 1

## 2022-03-14 MED ORDER — ASPIRIN 81 MG PO TBEC
81.0000 mg | DELAYED_RELEASE_TABLET | Freq: Every day | ORAL | Status: DC
  Administered 2022-03-14 – 2022-03-20 (×7): 81 mg via ORAL
  Filled 2022-03-14 (×7): qty 1

## 2022-03-14 MED ORDER — INSULIN ASPART 100 UNIT/ML IJ SOLN
0.0000 [IU] | Freq: Three times a day (TID) | INTRAMUSCULAR | Status: DC
  Administered 2022-03-15 – 2022-03-16 (×2): 2 [IU] via SUBCUTANEOUS
  Administered 2022-03-17: 3 [IU] via SUBCUTANEOUS
  Administered 2022-03-18 – 2022-03-20 (×3): 2 [IU] via SUBCUTANEOUS
  Filled 2022-03-14 (×6): qty 1

## 2022-03-14 MED ORDER — MORPHINE SULFATE (PF) 2 MG/ML IV SOLN
2.0000 mg | INTRAVENOUS | Status: DC | PRN
  Administered 2022-03-15 – 2022-03-18 (×12): 2 mg via INTRAVENOUS
  Filled 2022-03-14 (×13): qty 1

## 2022-03-14 MED ORDER — HYDRALAZINE HCL 20 MG/ML IJ SOLN: 10.0000 mg | Freq: Four times a day (QID) | INTRAMUSCULAR | Status: DC | PRN

## 2022-03-14 MED ORDER — SODIUM CHLORIDE 0.9 % IV SOLN: Freq: Once | INTRAVENOUS | Status: AC

## 2022-03-14 MED ORDER — SODIUM CHLORIDE 0.45 % IV SOLN
INTRAVENOUS | Status: DC
  Filled 2022-03-14 (×2): qty 75

## 2022-03-14 MED ORDER — SODIUM CHLORIDE 0.9 % IV BOLUS
1000.0000 mL | Freq: Once | INTRAVENOUS | Status: AC
  Administered 2022-03-14: 1000 mL via INTRAVENOUS

## 2022-03-14 MED ORDER — ACETAMINOPHEN 650 MG RE SUPP: 650.0000 mg | Freq: Four times a day (QID) | RECTAL | Status: DC | PRN

## 2022-03-14 MED ORDER — SODIUM CHLORIDE 0.9% FLUSH
3.0000 mL | Freq: Two times a day (BID) | INTRAVENOUS | Status: DC
  Administered 2022-03-14 – 2022-03-19 (×7): 3 mL via INTRAVENOUS

## 2022-03-14 MED ORDER — ACETAMINOPHEN 325 MG PO TABS: 650.0000 mg | ORAL_TABLET | Freq: Four times a day (QID) | ORAL | Status: DC | PRN

## 2022-03-14 NOTE — Assessment & Plan Note (Signed)
Lab Results  Component Value Date   CREATININE 1.30 (H) 03/14/2022   CREATININE 1.11 07/28/2013   Avoid nephrotoxic agents medications and contrast and renally dose all needed medications changed to  sliding scale regimen.

## 2022-03-14 NOTE — ED Provider Triage Note (Signed)
  Emergency Medicine Provider Triage Evaluation Note  Jordan Santana , a 71 y.o.male,  was evaluated in triage.  Pt complains of abnormal labs.  He is seen by his PCP because his CK was 21,840 and his AST was 608.  He states that he had COVID 2 weeks ago.  He does state that he has had rhabdomyolysis in the past and has needed to be hospitalized for it.  He states that he has maintained a stable exercise over the past month and has not had any injuries.  He states that his legs are sore, particularly in his thighs.  Denies any other symptoms.   Review of Systems  Positive: Myalgias. Negative: Denies fever, chest pain, vomiting  Physical Exam   Vitals:   03/14/22 1807  BP: (!) 149/74  Pulse: 83  Resp: 17  Temp: 98.2 F (36.8 C)  SpO2: 94%   Gen:   Awake, no distress   Resp:  Normal effort  MSK:   Moves extremities without difficulty  Other:    Medical Decision Making  Given the patient's initial medical screening exam, the following diagnostic evaluation has been ordered. The patient will be placed in the appropriate treatment space, once one is available, to complete the evaluation and treatment. I have discussed the plan of care with the patient and I have advised the patient that an ED physician or mid-level practitioner will reevaluate their condition after the test results have been received, as the results may give them additional insight into the type of treatment they may need.    Diagnostics: Labs, EKG, UA  Treatments: none immediately   Teodoro Spray, [place] 03/14/22 1818

## 2022-03-14 NOTE — ED Notes (Signed)
Skin is warm and dry.  Lungs are clear.  Abd is soft.  Bowel sounds present in all fields.  He reports normal bm today and he has voided per usual with noted amber colored urine.

## 2022-03-14 NOTE — H&P (Addendum)
History and Physical    Chief Complaint: myalgia   HISTORY OF PRESENT ILLNESS: Jordan Santana is an 71 y.o. male seen today for myalgias and muscle aches affecting his thigh his pelvic girdle and upper shoulder area.  Patient states that he has been having aches and has had 5 episodes of rhabdomyolysis and does not know the etiology.  States that in by due to a few people have a muscle biopsy. Patient has never seen a neurologist or a rheumatologist or had any any kind of an autoimmune evaluation for his said rhabdomyolysis. Daughter at bedside is a Marine scientist at [employer] answered all questions patient verbalized understanding and we will admit patient for work-up. Patient does not report any chest pain headaches blurred vision shortness of breath palpitations or weakness or incontinence or bleeding.  Pt has PMH as below: Past Medical History:  Diagnosis Date   Diabetes mellitus without complication ([place])    Dysrhythmia    Hypercholesteremia    Hypertension      Review of Systems  Musculoskeletal:  Positive for gait problem and myalgias.      Allergies  Allergen Reactions   Bee Venom     HONEY BEE   Biaxin [Clarithromycin] Rash     Past Surgical History:  Procedure Laterality Date   ABSESS TONGUE     COLONOSCOPY WITH PROPOFOL N/A 11/06/2017   Procedure: COLONOSCOPY WITH PROPOFOL;  Surgeon: Toledo, Benay Pike, MD;  Location: [facility] ENDOSCOPY;  Service: Gastroenterology;  Laterality: N/A;   COLONOSCOPY WITH PROPOFOL N/A 02/04/2021   Procedure: COLONOSCOPY WITH PROPOFOL;  Surgeon: Lesly Rubenstein, MD;  Location: [facility] ENDOSCOPY;  Service: Endoscopy;  Laterality: N/A;  DM   TUMOR ON FINGER     REMOVED      Social History   Socioeconomic History   Marital status: Married    Spouse name: Not on file   Number of children: Not on file   Years of education: Not on file   Highest education level: Not on file  Occupational History   Not on file  Tobacco Use   Smoking status: Never    Smokeless tobacco: Never  Vaping Use   Vaping Use: Never used  Substance and Sexual Activity   Alcohol use: Yes    Comment: rarekt   Drug use: Never   Sexual activity: Not on file  Other Topics Concern   Not on file  Social History Narrative   Not on file   Social Determinants of Health   Financial Resource Strain: Not on file  Food Insecurity: Not on file  Transportation Needs: Not on file  Physical Activity: Not on file  Stress: Not on file  Social Connections: Not on file      CURRENT MEDS:  Current Facility-Administered Medications (Endocrine & Metabolic):    [START ON 19/10/2227] insulin aspart (novoLOG) injection 0-15 Units  Current Outpatient Medications (Endocrine & Metabolic):    Canagliflozin-metFORMIN HCl ER [phone] MG TB24, Take 150-1,000 mg by mouth.   pioglitazone (ACTOS) 45 MG tablet, Take 45 mg by mouth daily.   Semaglutide (RYBELSUS) 14 MG TABS, Take 1 tablet by mouth daily. Do not cut, crush or chew  Current Facility-Administered Medications (Cardiovascular):    hydrALAZINE (APRESOLINE) injection 10 mg  Current Outpatient Medications (Cardiovascular):    icosapent Ethyl (VASCEPA) 1 g capsule, Take 2 g by mouth 2 (two) times daily.   irbesartan (AVAPRO) 150 MG tablet, Take 150 mg by mouth daily.   tadalafil (ADCIRCA/CIALIS) 20 MG tablet,  Take 20 mg by mouth daily as needed for erectile dysfunction.    Current Facility-Administered Medications (Analgesics):    acetaminophen (TYLENOL) tablet 650 mg **OR** acetaminophen (TYLENOL) suppository 650 mg   aspirin EC tablet 81 mg   morphine (PF) 2 MG/ML injection 2 mg  Current Outpatient Medications (Analgesics):    aspirin EC 81 MG tablet, Take 81 mg by mouth daily.   ibuprofen (ADVIL,MOTRIN) 800 MG tablet, Take 800 mg by mouth every 8 (eight) hours as needed.   traMADol (ULTRAM) 50 MG tablet, Take 50 mg by mouth every 6 (six) hours as needed.  Current Facility-Administered Medications (Hematological):     heparin injection 5,000 Units   Current Facility-Administered Medications (Other):    sodium bicarbonate 75 mEq in sodium chloride 0.45 % 1,075 mL infusion   sodium chloride flush (NS) 0.9 % injection 3 mL  Current Outpatient Medications (Other):    polyethylene glycol-electrolytes (NULYTELY/GOLYTELY) 420 g solution, Take 4,000 mLs by mouth once.    ED Course: Pt in Ed alert awake oriented afebrile.  Vitals:   03/14/22 1807  BP: (!) 149/74  Pulse: 83  Resp: 17  Temp: 98.2 F (36.8 C)  TempSrc: Oral  SpO2: 94%   No intake/output data recorded. SpO2: 94 % Blood work in ed shows acute kidney injury, rhabdomyolysis, transaminitis. Results for orders placed or performed during the hospital encounter of 03/14/22 (from the past 48 hour(s))  Comprehensive metabolic panel     Status: Abnormal   Collection Time: 03/14/22  6:09 PM  Result Value Ref Range   Sodium 138 135 - 145 mmol/L   Potassium 4.3 3.5 - 5.1 mmol/L   Chloride 101 98 - 111 mmol/L   CO2 29 22 - 32 mmol/L   Glucose, Bld 118 (H) 70 - 99 mg/dL    Comment: Glucose reference range applies only to samples taken after fasting for at least 8 hours.   BUN 31 (H) 8 - 23 mg/dL   Creatinine, Ser 1.30 (H) 0.61 - 1.24 mg/dL   Calcium 9.3 8.9 - 10.3 mg/dL   Total Protein 7.3 6.5 - 8.1 g/dL   Albumin 4.2 3.5 - 5.0 g/dL   AST 660 (H) 15 - 41 U/L   ALT 208 (H) 0 - 44 U/L   Alkaline Phosphatase 47 38 - 126 U/L   Total Bilirubin 0.7 0.3 - 1.2 mg/dL   GFR, Estimated 59 (L) >60 mL/min    Comment: (NOTE) Calculated using the CKD-EPI Creatinine Equation (2021)    Anion gap 8 5 - 15    Comment: Performed at [facility], [address] [place], [place] [address]  CK     Status: Abnormal   Collection Time: 03/14/22  6:09 PM  Result Value Ref Range   Total CK >50,000 (H) 49 - 397 U/L    Comment: RESULT CONFIRMED BY MANUAL DILUTION GAA Performed at [facility], [address] [place], [place] [address]    CBC with Differential     Status: None   Collection Time: 03/14/22  6:09 PM  Result Value Ref Range   WBC 8.9 4.0 - 10.5 K/uL   RBC 4.92 4.22 - 5.81 MIL/uL   Hemoglobin 14.4 13.0 - 17.0 g/dL   HCT 45.8 39.0 - 52.0 %   MCV 93.1 80.0 - 100.0 fL   MCH 29.3 26.0 - 34.0 pg   MCHC 31.4 30.0 - 36.0 g/dL   RDW 13.7 11.5 - 15.5 %   Platelets 288 150 -  400 K/uL   nRBC 0.0 0.0 - 0.2 %   Neutrophils Relative % 57 %   Neutro Abs 5.1 1.7 - 7.7 K/uL   Lymphocytes Relative 34 %   Lymphs Abs 3.0 0.7 - 4.0 K/uL   Monocytes Relative 7 %   Monocytes Absolute 0.6 0.1 - 1.0 K/uL   Eosinophils Relative 1 %   Eosinophils Absolute 0.1 0.0 - 0.5 K/uL   Basophils Relative 0 %   Basophils Absolute 0.0 0.0 - 0.1 K/uL   Immature Granulocytes 1 %   Abs Immature Granulocytes 0.06 0.00 - 0.07 K/uL    Comment: Performed at [facility] [address] [place], [place] [address]  Urinalysis, Routine w reflex microscopic Urine, Clean Catch     Status: Abnormal   Collection Time: 03/14/22  6:17 PM  Result Value Ref Range   Color, Urine AMBER (A) YELLOW    Comment: BIOCHEMICALS MAY BE AFFECTED BY COLOR   APPearance HAZY (A) CLEAR   Specific Gravity, Urine 1.022 1.005 - 1.030   pH 5.0 5.0 - 8.0   Glucose, UA >=500 (A) NEGATIVE mg/dL   Hgb urine dipstick LARGE (A) NEGATIVE   Bilirubin Urine NEGATIVE NEGATIVE   Ketones, ur NEGATIVE NEGATIVE mg/dL   Protein, ur 100 (A) NEGATIVE mg/dL   Nitrite NEGATIVE NEGATIVE   Leukocytes,Ua NEGATIVE NEGATIVE   RBC / HPF 0-5 0 - 5 RBC/hpf   WBC, UA 0-5 0 - 5 WBC/hpf   Bacteria, UA RARE (A) NONE SEEN   Squamous Epithelial / LPF 0-5 0 - 5   Mucus PRESENT    Amorphous Crystal PRESENT     Comment: Performed at [facility], [address] [place], [place] [address]    In Ed pt received  Meds ordered this encounter  Medications   sodium chloride 0.9 % bolus 1,000 mL   0.9 %  sodium chloride infusion   insulin aspart (novoLOG) injection 0-15 Units     Order Specific Question:   Correction coverage:    Answer:   Moderate (average weight, post-op)    Order Specific Question:   CBG < 70:    Answer:   implement hypoglycemia protocol    Order Specific Question:   CBG 70 - 120:    Answer:   0 units    Order Specific Question:   CBG 121 - 150:    Answer:   2 units    Order Specific Question:   CBG 151 - 200:    Answer:   3 units    Order Specific Question:   CBG 201 - 250:    Answer:   5 units    Order Specific Question:   CBG 251 - 300:    Answer:   8 units    Order Specific Question:   CBG 301 - 350:    Answer:   11 units    Order Specific Question:   CBG 351 - 400:    Answer:   15 units    Order Specific Question:   CBG > 400    Answer:   call MD and obtain STAT lab verification   heparin injection 5,000 Units   sodium chloride flush (NS) 0.9 % injection 3 mL   OR Linked Order Group    acetaminophen (TYLENOL) tablet 650 mg    acetaminophen (TYLENOL) suppository 650 mg   morphine (PF) 2 MG/ML injection 2 mg   aspirin EC tablet 81 mg   sodium bicarbonate 75  mEq in sodium chloride 0.45 % 1,075 mL infusion    Sodium Bicarbonate 75 mEq added to 1L 0.45% sodium chloride = 283 mOsm/L.   hydrALAZINE (APRESOLINE) injection 10 mg    Unresulted Labs (From admission, onward)     Start     Ordered   03/15/22 0500  Comprehensive metabolic panel  Tomorrow morning,   STAT        03/14/22 2134   03/15/22 0500  CBC  Tomorrow morning,   STAT        03/14/22 2134   03/14/22 2143  Urine Drug Screen, Qualitative ([place] only)  Once,   R        03/14/22 2142   03/14/22 2128  Hemoglobin A1c  Once,   URGENT       Comments: To assess prior glycemic control    03/14/22 2134   03/14/22 2122  Lactate dehydrogenase  Add-on,   AD        03/14/22 2121   03/14/22 2122  Aldolase  Add-on,   AD        03/14/22 2121   03/14/22 2122  ANA w/Reflex  Once,   URGENT        03/14/22 2121   03/14/22 2122  High sensitivity CRP  Once,   URGENT        03/14/22 2121    03/14/22 2108  Hepatitis panel, acute  Once,   URGENT        03/14/22 2121   03/14/22 2105  Magnesium  Add-on,   AD        03/14/22 2121           Admission Imaging : No results found.  Physical Examination: Vitals:   03/14/22 1807  BP: (!) 149/74  Pulse: 83  Temp: 98.2 F (36.8 C)  Resp: 17  SpO2: 94%  TempSrc: Oral   Physical Exam Vitals and nursing note reviewed.  Constitutional:      General: He is not in acute distress.    Appearance: Normal appearance. He is not ill-appearing, toxic-appearing or diaphoretic.  HENT:     Head: Normocephalic and atraumatic.     Right Ear: Hearing and external ear normal.     Left Ear: Hearing and external ear normal.     Nose: Nose normal. No nasal deformity.     Mouth/Throat:     Lips: Pink.     Mouth: Mucous membranes are moist.     Tongue: No lesions.     Pharynx: Oropharynx is clear.  Eyes:     Extraocular Movements: Extraocular movements intact.     Pupils: Pupils are equal, round, and reactive to light.  Neck:     Vascular: No carotid bruit.  Cardiovascular:     Rate and Rhythm: Normal rate and regular rhythm.     Pulses: Normal pulses.     Heart sounds: Normal heart sounds.  Pulmonary:     Effort: Pulmonary effort is normal.     Breath sounds: Normal breath sounds.  Abdominal:     General: Bowel sounds are normal. There is no distension.     Palpations: Abdomen is soft. There is no mass.     Tenderness: There is no abdominal tenderness. There is no guarding.     Hernia: No hernia is present.  Musculoskeletal:     Right lower leg: No edema.     Left lower leg: No edema.  Skin:    General: Skin is warm.  Neurological:  General: No focal deficit present.     Mental Status: He is alert and oriented to person, place, and time.     Cranial Nerves: Cranial nerves 2-12 are intact.     Motor: Motor function is intact.  Psychiatric:        Attention and Perception: Attention normal.        Mood and Affect:  Mood normal.        Speech: Speech normal.        Behavior: Behavior normal. Behavior is cooperative.        Cognition and Memory: Cognition normal.      Assessment and Plan: * Abnormal laboratory [place] of [address] and now 50000. Attribute to myositis.  We will cont with aggressive hydration.  Pt also found to have transaminitis and we will get liver usg.  AKI (acute kidney injury) ([place]) Lab Results  Component Value Date   CREATININE 1.30 (H) 03/14/2022   CREATININE 1.11 07/28/2013   Avoid nephrotoxic agents medications and contrast and renally dose all needed medications changed to  sliding scale regimen.  Non-traumatic rhabdomyolysis Patient presenting with elevated muscle enzymes mild AKI myalgias. Suspect patient has myositis and less likely rhabdo. We will contact neurology and request consult. Patient advised to get his blood work released and also follow-up with a rheumatologist. His colonoscopy work-up has been negative.  Patient has no known history of cancer. CPK today earlier in the day was 21,000 in the hospital today needs 50,000. We will start patient on IV fluids with serum bicarb over the next 24 hours to prevent any acute kidney injury worsening along with any electrolyte abnormalities causing any dysrhythmias and monitor patient on telemetry monitored unit.   DM II (diabetes mellitus, type II), controlled ([place]) We will start patient on insulin sliding scale regimen and hold all his antihyperglycemic agents discussed with daughter about considering additional different weight loss agents for diabetes as well as weight loss. And to avoid medications that may be causing hypotension and also monitor kidney function as patient has metformin on board. Carb consistent diet.   Essential hypertension Vitals:   03/14/22 1807  BP: (!) 149/74  Currently going to hold patient's Avapro. And monitor blood pressure and as needed hydralazine.     DVT  prophylaxis:  Heparin    Code Status:  Full code    Family CommunicationCULLY, LUCKOW  [phone]    Disposition Plan:  Home    Consults called:  Neurology: Dr.Kirtpatrick.   Admission status: Observation.   Unit/ Expected LOS: 3 days stay.    Para Skeans MD [facility]  6 PM- 2 AM. Please contact me via secure Chat 6 PM-2 AM. [phone] ( Pager ) To contact the [facility] Attending or Consulting provider [place] or covering provider during after hours [place], for this patient.   Check the care team in [facility] and look for a) attending/consulting [facility] provider listed and b) the [facility] team listed Log into [email or web address] and use [place]'s universal password to access. If you do not have the password, please contact the hospital operator. Locate the [facility] provider you are looking for under [facility] and page to a number that you can be directly reached. If you still have difficulty reaching the provider, please page the [facility] (Director on Call) for the Hospitalists listed on amion for assistance. [email or web address] 03/14/2022, 10:39 PM

## 2022-03-14 NOTE — Assessment & Plan Note (Signed)
We will start patient on insulin sliding scale regimen and hold all his antihyperglycemic agents discussed with daughter about considering additional different weight loss agents for diabetes as well as weight loss. And to avoid medications that may be causing hypotension and also monitor kidney function as patient has metformin on board. Carb consistent diet.

## 2022-03-14 NOTE — Assessment & Plan Note (Signed)
Patient presenting with elevated muscle enzymes mild AKI myalgias. Suspect patient has myositis and less likely rhabdo. We will contact neurology and request consult. Patient advised to get his blood work released and also follow-up with a rheumatologist. His colonoscopy work-up has been negative.  Patient has no known history of cancer. CPK today earlier in the day was 21,000 in the hospital today needs 50,000. We will start patient on IV fluids with serum bicarb over the next 24 hours to prevent any acute kidney injury worsening along with any electrolyte abnormalities causing any dysrhythmias and monitor patient on telemetry monitored unit.

## 2022-03-14 NOTE — ED Triage Notes (Signed)
Pt to ED via POV from home. Pt by PCP for abnormal labs. Pt reports he had COVID two weeks ago. Pt reports soreness and weakness.  Pt reports same level of exercise no increased exertion. Pt denies falls or injury.   Abnormal labs: [place] [address] AST 608

## 2022-03-14 NOTE — Assessment & Plan Note (Signed)
Vitals:   03/14/22 1807  BP: (!) 149/74  Currently going to hold patient's Avapro. And monitor blood pressure and as needed hydralazine.

## 2022-03-14 NOTE — Assessment & Plan Note (Addendum)
Pt's cpk of [address] and now 50000. Attribute to myositis.  We will cont with aggressive hydration.  Pt also found to have transaminitis and we will get liver usg.

## 2022-03-14 NOTE — ED Provider Notes (Signed)
[facility] Provider Note    Event Date/Time   First MD Initiated Contact with Patient 03/14/22 1929     (approximate)   History   No chief complaint on file.   HPI  Jordan Santana is a 71 y.o. male with past medical history significant for diabetes, who presents to the emergency department for lab work abnormalities.  States that he had COVID approximately 2 weeks ago.  Developed symptoms of COVID on Thursday, October 19, tested positive on Saturday.  States that he followed up with his primary care physician for muscle aches and pains and was told that he was in rhabdo.  States that he has had a history of rhabdomyolysis 5 times in the past and the last episode was approximately 10 years ago.  States that sometimes whenever he gets 6 his "body just cannot handle it and shuts down and causes me to have rhabdomyolysis".  Denies any new medications.  States that he has been drinking lots of water recently.  Does state that he has tea colored urine.  Denies any nausea or vomiting.  Muscle aches and pains to his upper legs and buttocks.  Denies any chest pain or shortness of breath.     Physical Exam   Triage Vital Signs: ED Triage Vitals  Enc Vitals Group     BP 03/14/22 1807 (!) 149/74     Pulse Rate 03/14/22 1807 83     Resp 03/14/22 1807 17     Temp 03/14/22 1807 98.2 F (36.8 C)     Temp Source 03/14/22 1807 Oral     SpO2 03/14/22 1807 94 %     Weight --      Height --      Head Circumference --      Peak Flow --      Pain Score 03/14/22 1814 6     Pain Loc --      Pain Edu? --      Excl. in [place]? --     Most recent vital signs: Vitals:   03/14/22 1807  BP: (!) 149/74  Pulse: 83  Resp: 17  Temp: 98.2 F (36.8 C)  SpO2: 94%    Physical Exam Constitutional:      Appearance: He is well-developed.  HENT:     Head: Atraumatic.  Eyes:     Conjunctiva/sclera: Conjunctivae normal.  Cardiovascular:     Rate and Rhythm: Regular rhythm.   Pulmonary:     Effort: No respiratory distress.  Musculoskeletal:     Cervical back: Normal range of motion.  Skin:    General: Skin is warm.  Neurological:     Mental Status: He is alert. Mental status is at baseline.          IMPRESSION / MDM / ASSESSMENT AND PLAN / ED COURSE  I reviewed the triage vital signs and the nursing notes.  71 year old male presents to the emergency department with concern for rhabdomyolysis.  On arrival afebrile, hemodynamically stable.  On review of outside records unable to see the patient's outside labs.  Per report patient has had a CK that was elevated in the 4000's.  Clinical picture most concerning for rhabdomyolysis secondary to viral illness.  On chart review of his home medications I do not see a statin.  Patient given a fluid bolus and then started on maintenance fluid at 200 an hour  EKG    RADIOLOGY       ED Results /  Procedures / Treatments   Labs (all labs ordered are listed, but only abnormal results are displayed) Labs interpreted as -   UA consistent with myoglobin.  Creatinine 1.3 with a baseline 1.11.  CK greater than 50,000.   Labs Reviewed  COMPREHENSIVE METABOLIC PANEL - Abnormal; Notable for the following components:      Result Value   Glucose, Bld 118 (*)    BUN 31 (*)    Creatinine, Ser 1.30 (*)    AST 660 (*)    ALT 208 (*)    GFR, Estimated 59 (*)    All other components within normal limits  CK - Abnormal; Notable for the following components:   Total CK >50,000 (*)    All other components within normal limits  URINALYSIS, ROUTINE W REFLEX MICROSCOPIC - Abnormal; Notable for the following components:   Color, Urine AMBER (*)    APPearance HAZY (*)    Glucose, UA >=500 (*)    Hgb urine dipstick LARGE (*)    Protein, ur 100 (*)    Bacteria, UA RARE (*)    All other components within normal limits  CBC WITH DIFFERENTIAL/PLATELET    Consulted the hospitalist and patient admitted for  rhabdomyolysis.   PROCEDURES:  Critical Care performed: No  Procedures  Patient's presentation is most consistent with acute presentation with potential threat to life or bodily function.   MEDICATIONS ORDERED IN ED: Medications  sodium chloride 0.9 % bolus 1,000 mL (1,000 mLs Intravenous New Bag/Given 03/14/22 2014)  0.9 %  sodium chloride infusion ( Intravenous New Bag/Given 03/14/22 2023)    FINAL CLINICAL IMPRESSION(S) / ED DIAGNOSES   Final diagnoses:  None     Rx / DC Orders   ED Discharge Orders     None        Note:  This document was prepared using Dragon voice recognition software and may include unintentional dictation errors.   Nathaniel Man, MD 03/14/22 2050

## 2022-03-15 ENCOUNTER — Inpatient Hospital Stay: Payer: Medicare Other

## 2022-03-15 DIAGNOSIS — I10 Essential (primary) hypertension: Secondary | ICD-10-CM

## 2022-03-15 DIAGNOSIS — R7401 Elevation of levels of liver transaminase levels: Secondary | ICD-10-CM | POA: Diagnosis present

## 2022-03-15 DIAGNOSIS — N179 Acute kidney failure, unspecified: Secondary | ICD-10-CM

## 2022-03-15 DIAGNOSIS — R748 Abnormal levels of other serum enzymes: Secondary | ICD-10-CM

## 2022-03-15 DIAGNOSIS — M6282 Rhabdomyolysis: Secondary | ICD-10-CM

## 2022-03-15 LAB — URINE DRUG SCREEN, QUALITATIVE (ARMC ONLY)
Amphetamines, Ur Screen: NOT DETECTED
Barbiturates, Ur Screen: NOT DETECTED
Benzodiazepine, Ur Scrn: NOT DETECTED
Cannabinoid 50 Ng, Ur ~~LOC~~: NOT DETECTED
Cocaine Metabolite,Ur ~~LOC~~: NOT DETECTED
MDMA (Ecstasy)Ur Screen: NOT DETECTED
Methadone Scn, Ur: NOT DETECTED
Opiate, Ur Screen: NOT DETECTED
Phencyclidine (PCP) Ur S: NOT DETECTED
Tricyclic, Ur Screen: NOT DETECTED

## 2022-03-15 LAB — GLUCOSE, CAPILLARY
Glucose-Capillary: 100 mg/dL — ABNORMAL HIGH (ref 70–99)
Glucose-Capillary: 146 mg/dL — ABNORMAL HIGH (ref 70–99)
Glucose-Capillary: 144 mg/dL — ABNORMAL HIGH (ref 70–99)
Glucose-Capillary: 89 mg/dL (ref 70–99)
Glucose-Capillary: 96 mg/dL (ref 70–99)

## 2022-03-15 LAB — MAGNESIUM: Magnesium: 2.5 mg/dL — ABNORMAL HIGH (ref 1.7–2.4)

## 2022-03-15 LAB — HEPATITIS PANEL, ACUTE
HCV Ab: NONREACTIVE
Hep A IgM: NONREACTIVE
Hep B C IgM: NONREACTIVE
Hepatitis B Surface Ag: NONREACTIVE

## 2022-03-15 LAB — HEMOGLOBIN A1C
Hgb A1c MFr Bld: 6.4 % — ABNORMAL HIGH (ref 4.8–5.6)
Mean Plasma Glucose: 136.98 mg/dL

## 2022-03-15 LAB — CBC
HCT: 41.5 % (ref 39.0–52.0)
Hemoglobin: 13 g/dL (ref 13.0–17.0)
MCH: 29.1 pg (ref 26.0–34.0)
MCHC: 31.3 g/dL (ref 30.0–36.0)
MCV: 93 fL (ref 80.0–100.0)
Platelets: 245 10*3/uL (ref 150–400)
RBC: 4.46 MIL/uL (ref 4.22–5.81)
RDW: 13.6 % (ref 11.5–15.5)
WBC: 7.2 10*3/uL (ref 4.0–10.5)
nRBC: 0 % (ref 0.0–0.2)

## 2022-03-15 LAB — CK
Total CK: >50000 U/L — ABNORMAL HIGH (ref 49–397)
Total CK: >50000 U/L — ABNORMAL HIGH (ref 49–397)

## 2022-03-15 LAB — COMPREHENSIVE METABOLIC PANEL
ALT: 166 U/L — ABNORMAL HIGH (ref 0–44)
AST: 521 U/L — ABNORMAL HIGH (ref 15–41)
Albumin: 3.3 g/dL — ABNORMAL LOW (ref 3.5–5.0)
Alkaline Phosphatase: 37 U/L — ABNORMAL LOW (ref 38–126)
Anion gap: 5 (ref 5–15)
BUN: 29 mg/dL — ABNORMAL HIGH (ref 8–23)
CO2: 27 mmol/L (ref 22–32)
Calcium: 8.5 mg/dL — ABNORMAL LOW (ref 8.9–10.3)
Chloride: 105 mmol/L (ref 98–111)
Creatinine, Ser: 1.28 mg/dL — ABNORMAL HIGH (ref 0.61–1.24)
GFR, Estimated: 60 mL/min — ABNORMAL LOW (ref >60)
Glucose, Bld: 133 mg/dL — ABNORMAL HIGH (ref 70–99)
Potassium: 4.4 mmol/L (ref 3.5–5.1)
Sodium: 137 mmol/L (ref 135–145)
Total Bilirubin: 0.7 mg/dL (ref 0.3–1.2)
Total Protein: 5.7 g/dL — ABNORMAL LOW (ref 6.5–8.1)

## 2022-03-15 LAB — LACTATE DEHYDROGENASE: LDH: 2597 U/L — ABNORMAL HIGH (ref 98–192)

## 2022-03-15 MED ORDER — SODIUM CHLORIDE 0.45 % IV SOLN
INTRAVENOUS | Status: DC
  Filled 2022-03-15 (×7): qty 75

## 2022-03-15 NOTE — Progress Notes (Signed)
[facility]                                                                              Darrin Koman, is a 71 y.o. male, DOB - 02/09/1951, ZOX:[id number] Admit date - 03/14/2022    Outpatient Primary MD for the patient is [name], MD  LOS - 1  days  No chief complaint on file.      Brief summary   Patient is a 71 year old male with history of recurrent rhabdomyolysis, hypertension, hyperlipidemia, diabetes mellitus presented with myalgias and muscle aches affecting his thighs, pelvic girdle and upper shoulders.  Patient reported that he has been having aches and has had 4 prior episodes of rhabdomyolysis. In ED, patient was found to have mild AKI, creatinine 1.3, no baseline available CK> for 2000 on admission, LDH 2597 AST 660, ALT 208, alk phos 47, total bilirubin 0.7   Assessment & Plan    Principal Problem:   AKI (acute kidney injury) ([place]) -Likely due to acute rhabdomyolysis -Creatinine 1.3 on admission, continue aggressive IV fluid hydration -Creatinine improving 1.2 today -Hold irbesartan, HCTZ  Active Problems:    Non-traumatic rhabdomyolysis, recurrent -Likely precipitated due to COVID 2 weeks ago -Per patient, this is his fifth episode of rhabdomyolysis, has had evaluation in the past with no clear etiology found -Neuro consulted, will await recommendations.  Likely will need to follow-up with rheumatology outpatient -CK>50,000 on admission, still > 50000  Transaminitis -Likely due to rhabdomyolysis, improving -Abdominal ultrasound normal    Essential hypertension -BP stable, continue IV hydralazine as needed with parameters     DM II (diabetes mellitus, type II), controlled ([place]) -Hold semaglutide, metformin, canagliflozin, Actos -Continue sliding scale insulin while inpatient HbA1c 6.4 Recent Labs    03/15/22 0857 03/15/22 1141 03/15/22 1307  GLUCAP 100* 146* 144*    Obesity Estimated body mass index is 41.8 kg/m as  calculated from the following:   Height as of 02/04/21: _0  (1.676 m).   Weight as of this encounter: 117.5 kg.  Code Status: full  DVT Prophylaxis:  heparin injection 5,000 Units Start: 03/14/22 2300   Level of Care: Level of care: Telemetry Medical Family Communication: Updated patient's wife and daughter at the bedside   Disposition Plan:      Remains inpatient appropriate: CK still elevated, on IV fluids   Procedures:  Abdominal ultrasound  Consultants:   Neurology  Antimicrobials: None     Medications  aspirin EC  81 mg Oral Daily   heparin  5,000 Units Subcutaneous Q8H   insulin aspart  0-15 Units Subcutaneous TID WC   sodium chloride flush  3 mL Intravenous Q12H      Subjective:   Carlester Kasparek was seen and examined today.  Still has myalgias and muscle aches.  No acute fevers or chills, nausea or vomiting.  Patient denies dizziness, chest pain, shortness of breath.   Objective:   Vitals:   03/14/22 2347 03/15/22 0503 03/15/22 0902 03/15/22 1303  BP: (!) 131/59 129/75 126/68 116/65  Pulse: 65 63 (!) 59 69  Resp: _1 Temp: 98.8 F (37.1 C)  98.5 F (36.9 C) 98.3 F (36.8 C) 98.8 F (37.1 C)  TempSrc: Oral Oral Oral Oral  SpO2: 100% 99% 96% 98%  Weight:  117.5 kg      Intake/Output Summary (Last 24 hours) at 03/15/2022 1438 Last data filed at 03/15/2022 1313 Gross per 24 hour  Intake 240 ml  Output 3775 ml  Net -3535 ml     Wt Readings from Last 3 Encounters:  03/15/22 117.5 kg  02/04/21 111.1 kg  11/06/17 113.4 kg     Exam General: Alert and oriented x 3, NAD Cardiovascular: S1 S2 auscultated,  RRR Respiratory: Clear to auscultation bilaterally, no wheezing, rales or rhonchi Gastrointestinal: Soft, nontender, nondistended, + bowel sounds Ext: no pedal edema bilaterally Neuro: no new FND's Psych: Normal affect and demeanor, alert and oriented x3     Data Reviewed:  I have personally reviewed following labs    CBC Lab  Results  Component Value Date   WBC 7.2 03/15/2022   RBC 4.46 03/15/2022   HGB 13.0 03/15/2022   HCT 41.5 03/15/2022   MCV 93.0 03/15/2022   MCH 29.1 03/15/2022   PLT 245 03/15/2022   MCHC 31.3 03/15/2022   RDW 13.6 03/15/2022   LYMPHSABS 3.0 03/14/2022   MONOABS 0.6 03/14/2022   EOSABS 0.1 03/14/2022   BASOSABS 0.0 41/07/129     Last metabolic panel Lab Results  Component Value Date   NA 137 03/15/2022   K 4.4 03/15/2022   CL 105 03/15/2022   CO2 27 03/15/2022   BUN 29 (H) 03/15/2022   CREATININE 1.28 (H) 03/15/2022   GLUCOSE 133 (H) 03/15/2022   GFRNONAA 60 (L) 03/15/2022   GFRAA >60 07/28/2013   CALCIUM 8.5 (L) 03/15/2022   PROT 5.7 (L) 03/15/2022   ALBUMIN 3.3 (L) 03/15/2022   BILITOT 0.7 03/15/2022   ALKPHOS 37 (L) 03/15/2022   AST 521 (H) 03/15/2022   ALT 166 (H) 03/15/2022   ANIONGAP 5 03/15/2022    CBG (last 3)  Recent Labs    03/15/22 0857 03/15/22 1141 03/15/22 1307  GLUCAP 100* 146* 144*      Coagulation Profile: No results for input(s): "INR", "PROTIME" in the last 168 hours.   Radiology Studies: I have personally reviewed the imaging studies  US Abdomen Limited RUQ (LIVER/GB)  Result Date: 03/15/2022 CLINICAL DATA:  Abnormal LFTs EXAM: ULTRASOUND ABDOMEN LIMITED RIGHT UPPER QUADRANT COMPARISON:  None Available. FINDINGS: Gallbladder: No gallstones or wall thickening visualized. No sonographic Murphy sign noted by sonographer. Common bile duct: Diameter: 0.4 cm Liver: No focal lesion identified. Within normal limits in parenchymal echogenicity. Portal vein is patent on color Doppler imaging with normal direction of blood flow towards the liver. Other: None. IMPRESSION: No sonographic finding to explain abnormal LFTs on the provided images. Electronically Signed   By: Marin Roberts M.D.   On: 03/15/2022 09:53       Hetal Proano M.D. [facility] 03/15/2022, 2:38 PM  Available via Epic secure chat 7am-7pm After 7 pm, please refer to  night coverage provider listed on amion.

## 2022-03-15 NOTE — Consult Note (Signed)
Neurology Consultation Reason for Consult: Rhabdomyolysis Referring Physician: Rai, R  CC: Muscle aches  History is obtained from: Patient  HPI: Jordan Santana is a 71 y.o. male who presents with his fifth episode of rhabdomyolysis.  He states that his first was many years ago, around 89.  He has had subsequent episodes every 8 to 10 years, with this being his fifth episode.  He states that each episode has been preceded by getting "rundown" such as being overworked or having other stressors.  Few weeks ago, he had COVID-19, and then as he was recovering he remembers going to work out on a Monday and first noticed his achy muscles on Tuesday.  He did not work out any further.  Over the past few days, he has noticed change in color of his urine and was in significant discomfort and therefore sought care in the emergency department.  He reports that with his last episode few years ago, he did not seek care in the emergency department but just drink plenty of fluids.  Previously his episodes have lasted approximately 2 weeks, and spontaneously improved.  He has not received steroids for them in the past.    Past Medical History:  Diagnosis Date   Diabetes mellitus without complication ([place])    Dysrhythmia    Hypercholesteremia    Hypertension      History reviewed. No pertinent family history.   Social History:  reports that he has never smoked. He has never used smokeless tobacco. He reports current alcohol use. He reports that he does not use drugs.   Exam: Current vital signs: BP 126/68 (BP Location: Left Arm)   Pulse (!) 59   Temp 98.3 F (36.8 C) (Oral)   Resp 18   Wt 117.5 kg   SpO2 96%   BMI 41.80 kg/m  Vital signs in last 24 hours: Temp:  [98 F (36.7 C)-98.8 F (37.1 C)] 98.3 F (36.8 C) (11/01 0902) Pulse Rate:  [59-83] 59 (11/01 0902) Resp:  [15-24] 18 (11/01 0902) BP: (117-149)/(59-77) 126/68 (11/01 0902) SpO2:  [92 %-100 %] 96 % (11/01 0902) Weight:  [117.5  kg] 117.5 kg (11/01 0503)   Physical Exam  Constitutional: Appears well-developed and well-nourished.  Psych: Affect appropriate to situation Eyes: No scleral injection HENT: No OP obstruction MSK: no joint deformities.  Cardiovascular: Normal rate and regular rhythm.  Respiratory: Effort normal, non-labored breathing GI: Soft.  No distension. There is no tenderness.  Skin: WDI  Neuro: Mental Status: Patient is awake, alert, oriented to person, place, month, year, and situation. Patient is able to give a clear and coherent history. No signs of aphasia or neglect Cranial Nerves: II: Visual Fields are full. Pupils are equal, round, and reactive to light.   III,IV, VI: EOMI without ptosis or diploplia.  V: Facial sensation is symmetric to temperature VII: Facial movement is symmetric.  VIII: hearing is intact to voice X: Uvula elevates symmetrically XI: Shoulder shrug is symmetric. XII: tongue is midline without atrophy or fasciculations.  Motor: Tone is normal. Bulk is normal. 5/5 strength was present in all four extremities.  Sensory: Sensation is symmetric to light touch and temperature in the arms and legs. Deep Tendon Reflexes: 2+ and symmetric in the biceps and diminished at the patellae.  Cerebellar: FNF and HKS are intact bilaterally      I have reviewed labs in epic and the results pertinent to this consultation are: CK greater than 50000   Impression: 71 year old male with  recurrent episodes of rhabdomyolysis.  It does sound like this episode occurred in the setting of a recent physiological stressor (COVID) coupled with working out.  I suspect that he is someone who is predisposed to develop rhabdomyolysis in the setting of acute physiological stress.  There are certain genetic predispositions that may be worth investigating, but I would favor having him evaluated by neuromuscular specialist prior to sending this.  On review of his medications, pioglitazone has  been associated with rhabdomyolysis through mechanism of increasing oxidative stress.  I do wonder if there are other options, it may be be beneficial to reduce the adjunct of intracellular stress in someone who is predisposed to rhabdo.  I advised him to discuss this when he sees a neuromuscular specialist.  I think that inflammatory nature to his presentation is very unlikely, and the fact that he checked his episodes has been self-limited and spread over the course of 40 years would argue against an autoimmune cause.  Recommendations: 1) continue fluids and supportive care to prevent renal dysfunction. 2) follow-up CRP, ANA 3) neurology will follow     Roland Rack, MD [facility] [phone]  If 7pm- 7am, please page neurology on call as listed in [place].

## 2022-03-15 NOTE — TOC Initial Note (Signed)
Transition of Care [facility]) - Initial/Assessment Note    Patient Details  Name: Jordan Santana MRN: [id number] Date of Birth: 13-Apr-1951  Transition of Care [facility]) CM/SW Contact:    Beverly Sessions, RN Phone Number: 03/15/2022, 9:05 AM  Clinical Narrative:                   Transition of Care [facility]) Screening Note   Patient Details  Name: Jordan Santana Date of Birth: 1950-05-22   Transition of Care [facility]) CM/SW Contact:    Beverly Sessions, RN Phone Number: 03/15/2022, 9:05 AM    Transition of Care Department [facility]) has reviewed patient and no TOC needs have been identified at this time. We will continue to monitor patient advancement through interdisciplinary progression rounds. If new patient transition needs arise, please place a TOC consult.         Patient Goals and CMS Choice        Expected Discharge Plan and Services                                                Prior Living Arrangements/Services                       Activities of Daily Living Home Assistive Devices/Equipment: None ADL Screening (condition at time of admission) Patient's cognitive ability adequate to safely complete daily activities?: Yes Is the patient deaf or have difficulty hearing?: No Does the patient have difficulty seeing, even when wearing glasses/contacts?: No Does the patient have difficulty concentrating, remembering, or making decisions?: No Patient able to express need for assistance with ADLs?: Yes Does the patient have difficulty dressing or bathing?: No Independently performs ADLs?: Yes (appropriate for developmental age) Does the patient have difficulty walking or climbing stairs?: No Weakness of Legs: Both Weakness of Arms/Hands: None  Permission Sought/Granted                  Emotional Assessment              Admission diagnosis:  Abnormal laboratory test [R89.9] Elevated CK [R74.8] Non-traumatic rhabdomyolysis [M62.82] Patient  Active Problem List   Diagnosis Date Noted   Non-traumatic rhabdomyolysis 03/14/2022   AKI (acute kidney injury) ([place]) 03/14/2022   Abnormal laboratory test 03/14/2022   Essential hypertension 03/15/2018   DM II (diabetes mellitus, type II), controlled ([place]) 03/15/2018   PCP:  [name], MD Pharmacy:  No Pharmacies Listed    Social Determinants of Health (SDOH) Interventions    Readmission Risk Interventions     No data to display

## 2022-03-15 NOTE — Plan of Care (Signed)

## 2022-03-15 NOTE — Progress Notes (Signed)
Mobility Specialist - Progress Note   03/15/22 1420  Mobility  Activity Ambulated independently in hallway;Stood at bedside;Dangled on edge of bed  Level of Assistance Independent  Assistive Device None  Distance Ambulated (ft) 320 ft  Activity Response Tolerated well  Mobility Referral Yes  $Mobility charge 1 Mobility   Pt supine in bed on RA upon arrival. Pt STS and ambulates in hallway indep. Pt returns to bed with needs in reach.   Gretchen Short  Mobility Specialist  03/15/22 2:22 PM

## 2022-03-16 DIAGNOSIS — M6282 Rhabdomyolysis: Secondary | ICD-10-CM | POA: Diagnosis not present

## 2022-03-16 DIAGNOSIS — N179 Acute kidney failure, unspecified: Secondary | ICD-10-CM | POA: Diagnosis not present

## 2022-03-16 DIAGNOSIS — N170 Acute kidney failure with tubular necrosis: Secondary | ICD-10-CM | POA: Diagnosis present

## 2022-03-16 DIAGNOSIS — R748 Abnormal levels of other serum enzymes: Secondary | ICD-10-CM | POA: Diagnosis not present

## 2022-03-16 LAB — COMPREHENSIVE METABOLIC PANEL
ALT: 168 U/L — ABNORMAL HIGH (ref 0–44)
AST: 500 U/L — ABNORMAL HIGH (ref 15–41)
Albumin: 3.3 g/dL — ABNORMAL LOW (ref 3.5–5.0)
Alkaline Phosphatase: 37 U/L — ABNORMAL LOW (ref 38–126)
Anion gap: 6 (ref 5–15)
BUN: 21 mg/dL (ref 8–23)
CO2: 30 mmol/L (ref 22–32)
Calcium: 8.7 mg/dL — ABNORMAL LOW (ref 8.9–10.3)
Chloride: 104 mmol/L (ref 98–111)
Creatinine, Ser: 1.1 mg/dL (ref 0.61–1.24)
GFR, Estimated: >60 mL/min (ref >60)
Glucose, Bld: 116 mg/dL — ABNORMAL HIGH (ref 70–99)
Potassium: 4.4 mmol/L (ref 3.5–5.1)
Sodium: 140 mmol/L (ref 135–145)
Total Bilirubin: 0.7 mg/dL (ref 0.3–1.2)
Total Protein: 5.8 g/dL — ABNORMAL LOW (ref 6.5–8.1)

## 2022-03-16 LAB — GLUCOSE, CAPILLARY
Glucose-Capillary: 100 mg/dL — ABNORMAL HIGH (ref 70–99)
Glucose-Capillary: 110 mg/dL — ABNORMAL HIGH (ref 70–99)
Glucose-Capillary: 136 mg/dL — ABNORMAL HIGH (ref 70–99)
Glucose-Capillary: 91 mg/dL (ref 70–99)

## 2022-03-16 LAB — ALDOLASE: Aldolase: <1.2 U/L — ABNORMAL LOW (ref 3.3–10.3)

## 2022-03-16 LAB — CK
Total CK: >50000 U/L — ABNORMAL HIGH (ref 49–397)
Total CK: >50000 U/L — ABNORMAL HIGH (ref 49–397)
Total CK: >50000 U/L — ABNORMAL HIGH (ref 49–397)

## 2022-03-16 LAB — ANA W/REFLEX: Anti Nuclear Antibody (ANA): NEGATIVE

## 2022-03-16 MED ORDER — POLYETHYLENE GLYCOL 3350 17 G PO PACK
17.0000 g | PACK | Freq: Every day | ORAL | Status: DC
  Administered 2022-03-16 – 2022-03-19 (×4): 17 g via ORAL
  Filled 2022-03-16 (×4): qty 1

## 2022-03-16 NOTE — Progress Notes (Signed)
Mobility Specialist - Progress Note   03/16/22 1539  Mobility  Activity Ambulated independently in hallway;Stood at bedside;Dangled on edge of bed  Level of Assistance Independent  Assistive Device None  Distance Ambulated (ft) 1000 ft  Activity Response Tolerated well  $Mobility charge 1 Mobility   Pt supine in bed on RA upon arrival. Pt STS and ambulates in hallway indep. Pt returns to bed with needs in reach and family in room.   Gretchen Short  Mobility Specialist  03/16/22 3:40 PM

## 2022-03-16 NOTE — Progress Notes (Signed)
Mobility Specialist - Progress Note   03/16/22 0942  Mobility  Activity Ambulated independently in hallway;Stood at bedside;Dangled on edge of bed  Level of Assistance Independent  Assistive Device None  Distance Ambulated (ft) 440 ft  Activity Response Tolerated well  $Mobility charge 1 Mobility   Pt supine in bed on RA upon arrival. Pt STS and ambulates in hallway indep with no LOB. Pt returns to recliner, with needs in reach, and family in room.   Gretchen Short  Mobility Specialist  03/16/22 9:43 AM

## 2022-03-16 NOTE — Progress Notes (Signed)
[facility]                                                                              Jordan Santana, is a 71 y.o. male, DOB - 1951-03-27, YSA:[id number] Admit date - 03/14/2022    Outpatient Primary MD for the patient is [name], MD  LOS - 2  days  No chief complaint on file.      Brief summary   Patient is a 71 year old male with history of recurrent rhabdomyolysis, hypertension, hyperlipidemia, diabetes mellitus presented with myalgias and muscle aches affecting his thighs, pelvic girdle and upper shoulders.  Patient reported that he has been having aches and has had 4 prior episodes of rhabdomyolysis. In ED, patient was found to have mild AKI, creatinine 1.3, no baseline available CK> for 2000 on admission, LDH 2597 AST 660, ALT 208, alk phos 47, total bilirubin 0.7   Assessment & Plan    Principal Problem:   AKI (acute kidney injury) ([place]) -Likely due to acute rhabdomyolysis -Creatinine 1.3 on admission, -Continue aggressive IV fluid hydration, creatinine now improved to 1.1  -Hold irbesartan, HCTZ  Active Problems:    Non-traumatic rhabdomyolysis, recurrent -Likely precipitated due to COVID 2 weeks ago -Per patient, this is his fifth episode of rhabdomyolysis, has had evaluation in the past with no clear etiology found -Neuro consulted, unlikely to be inflammatory in nature, episodes spread over the course of 40 years would argue against an autoimmune cause, recommended to follow-up with neuromuscular specialist outpatient.   - Likely will need to follow-up with rheumatology outpatient -CK>50,000 on admission, still > 50000, requested nephrology consult  Transaminitis -Likely due to rhabdomyolysis, LFTs improving -Abdominal ultrasound normal    Essential hypertension -BP stable, continue IV hydralazine as needed     DM II (diabetes mellitus, type II), controlled ([place]) -Hold semaglutide, metformin, canagliflozin, Actos -Continue sliding  scale insulin while inpatient HbA1c 6.4   Obesity Estimated body mass index is 41.74 kg/m as calculated from the following:   Height as of 02/04/21: _0  (1.676 m).   Weight as of this encounter: 117.3 kg.  Code Status: full  DVT Prophylaxis:  heparin injection 5,000 Units Start: 03/14/22 2300   Level of Care: Level of care: Telemetry Medical Family Communication: Updated patient's daughter at the bedside   Disposition Plan:      Remains inpatient appropriate: CK still elevated, on IV fluids   Procedures:  Abdominal ultrasound  Consultants:   Neurology Nephrology  Antimicrobials: None     Medications  aspirin EC  81 mg Oral Daily   heparin  5,000 Units Subcutaneous Q8H   insulin aspart  0-15 Units Subcutaneous TID WC   polyethylene glycol  17 g Oral Daily   sodium chloride flush  3 mL Intravenous Q12H      Subjective:   Jordan Santana was seen and examined today.  States urine is no longer dark, still has muscle aches, chronic back pain.,  No acute dizziness, chest pain, shortness of breath, nausea or vomiting.   Objective:   Vitals:   03/15/22 1303 03/15/22 1944 03/16/22 0434 03/16/22 0453  BP: 116/65 Marland Kitchen)  112/48 116/68   Pulse: 69 73 65   Resp: _0 Temp: 98.8 F (37.1 C) 99.1 F (37.3 C) 98 F (36.7 C)   TempSrc: Oral Oral Oral   SpO2: 98% 97% 96%   Weight:    117.3 kg    Intake/Output Summary (Last 24 hours) at 03/16/2022 1229 Last data filed at 03/16/2022 0934 Gross per 24 hour  Intake 2291.28 ml  Output 6525 ml  Net -4233.72 ml     Wt Readings from Last 3 Encounters:  03/16/22 117.3 kg  02/04/21 111.1 kg  11/06/17 113.4 kg    Physical Exam General: Alert and oriented x 3, NAD Cardiovascular: S1 S2 clear, RRR.  Respiratory: CTAB, no wheezing, rales Gastrointestinal: Soft, nontender, nondistended, NBS Ext: no pedal edema bilaterally Neuro: no new deficits Psych: Normal affect    Data Reviewed:  I have personally reviewed  following labs    CBC Lab Results  Component Value Date   WBC 7.2 03/15/2022   RBC 4.46 03/15/2022   HGB 13.0 03/15/2022   HCT 41.5 03/15/2022   MCV 93.0 03/15/2022   MCH 29.1 03/15/2022   PLT 245 03/15/2022   MCHC 31.3 03/15/2022   RDW 13.6 03/15/2022   LYMPHSABS 3.0 03/14/2022   MONOABS 0.6 03/14/2022   EOSABS 0.1 03/14/2022   BASOSABS 0.0 42/68/3419     Last metabolic panel Lab Results  Component Value Date   NA 140 03/16/2022   K 4.4 03/16/2022   CL 104 03/16/2022   CO2 30 03/16/2022   BUN 21 03/16/2022   CREATININE 1.10 03/16/2022   GLUCOSE 116 (H) 03/16/2022   GFRNONAA >60 03/16/2022   GFRAA >60 07/28/2013   CALCIUM 8.7 (L) 03/16/2022   PROT 5.8 (L) 03/16/2022   ALBUMIN 3.3 (L) 03/16/2022   BILITOT 0.7 03/16/2022   ALKPHOS 37 (L) 03/16/2022   AST 500 (H) 03/16/2022   ALT 168 (H) 03/16/2022   ANIONGAP 6 03/16/2022    CBG (last 3)  Recent Labs    03/15/22 2128 03/16/22 0829 03/16/22 1126  GLUCAP 96 100* 110*      Coagulation Profile: No results for input(s): "INR", "PROTIME" in the last 168 hours.   Radiology Studies: I have personally reviewed the imaging studies  US Abdomen Limited RUQ (LIVER/GB)  Result Date: 03/15/2022 CLINICAL DATA:  Abnormal LFTs EXAM: ULTRASOUND ABDOMEN LIMITED RIGHT UPPER QUADRANT COMPARISON:  None Available. FINDINGS: Gallbladder: No gallstones or wall thickening visualized. No sonographic Murphy sign noted by sonographer. Common bile duct: Diameter: 0.4 cm Liver: No focal lesion identified. Within normal limits in parenchymal echogenicity. Portal vein is patent on color Doppler imaging with normal direction of blood flow towards the liver. Other: None. IMPRESSION: No sonographic finding to explain abnormal LFTs on the provided images. Electronically Signed   By: Marin Roberts M.D.   On: 03/15/2022 09:53       Aislynn Cifelli M.D. [facility] 03/16/2022, 12:29 PM  Available via Epic secure chat 7am-7pm After 7 pm,  please refer to night coverage provider listed on amion.

## 2022-03-16 NOTE — Progress Notes (Signed)
No significant changes, he does have very mild hip flexion weakness bilaterally.  CKs remain greater than 50,000.  Impression: 71 year old male with recurrent episodes of rhabdomyolysis.  It does sound like this episode occurred in the setting of a recent physiological stressor (COVID) coupled with working out.  I suspect that he is someone who is predisposed to develop rhabdomyolysis in the setting of acute physiological stress.  There are certain genetic predispositions that may be worth investigating, but I would favor having him evaluated by neuromuscular specialist prior to sending this.   On review of his medications, pioglitazone has been associated with rhabdomyolysis through mechanism of increasing oxidative stress.  I do wonder if there are other options, it may be be beneficial to reduce the adjunct of intracellular stress in someone who is predisposed to rhabdo.  I advised him to discuss this when he sees a neuromuscular specialist.   I think that inflammatory nature to his presentation is very unlikely, and the fact that  his episodes have been self-limited and spread over the course of 40 years would argue against an autoimmune cause.   Recommendations: 1) continue fluids and supportive care to prevent renal dysfunction. 2) follow-up CRP, ANA 3) neurology will be available as needed. 4) he wishes to follow-up with [facility], would recommend neuromuscular clinic.  Roland Rack, MD [facility] [phone]  If 7pm- 7am, please page neurology on call as listed in [place].

## 2022-03-16 NOTE — Plan of Care (Signed)

## 2022-03-17 DIAGNOSIS — R748 Abnormal levels of other serum enzymes: Secondary | ICD-10-CM | POA: Diagnosis not present

## 2022-03-17 DIAGNOSIS — N179 Acute kidney failure, unspecified: Secondary | ICD-10-CM | POA: Diagnosis not present

## 2022-03-17 DIAGNOSIS — M6282 Rhabdomyolysis: Secondary | ICD-10-CM | POA: Diagnosis not present

## 2022-03-17 LAB — COMPREHENSIVE METABOLIC PANEL
ALT: 166 U/L — ABNORMAL HIGH (ref 0–44)
AST: 431 U/L — ABNORMAL HIGH (ref 15–41)
Albumin: 2.9 g/dL — ABNORMAL LOW (ref 3.5–5.0)
Alkaline Phosphatase: 37 U/L — ABNORMAL LOW (ref 38–126)
Anion gap: 8 (ref 5–15)
BUN: 17 mg/dL (ref 8–23)
CO2: 28 mmol/L (ref 22–32)
Calcium: 8.6 mg/dL — ABNORMAL LOW (ref 8.9–10.3)
Chloride: 106 mmol/L (ref 98–111)
Creatinine, Ser: 1.12 mg/dL (ref 0.61–1.24)
GFR, Estimated: >60 mL/min (ref >60)
Glucose, Bld: 115 mg/dL — ABNORMAL HIGH (ref 70–99)
Potassium: 4.2 mmol/L (ref 3.5–5.1)
Sodium: 142 mmol/L (ref 135–145)
Total Bilirubin: 0.6 mg/dL (ref 0.3–1.2)
Total Protein: 5.4 g/dL — ABNORMAL LOW (ref 6.5–8.1)

## 2022-03-17 LAB — GLUCOSE, CAPILLARY
Glucose-Capillary: 87 mg/dL (ref 70–99)
Glucose-Capillary: 132 mg/dL — ABNORMAL HIGH (ref 70–99)
Glucose-Capillary: 172 mg/dL — ABNORMAL HIGH (ref 70–99)
Glucose-Capillary: 136 mg/dL — ABNORMAL HIGH (ref 70–99)

## 2022-03-17 LAB — CK
Total CK: >50000 U/L — ABNORMAL HIGH (ref 49–397)
Total CK: 48947 U/L — ABNORMAL HIGH (ref 49–397)
Total CK: >50000 U/L — ABNORMAL HIGH (ref 49–397)

## 2022-03-17 LAB — HIGH SENSITIVITY CRP: CRP, High Sensitivity: 3.55 mg/L — ABNORMAL HIGH (ref 0.00–3.00)

## 2022-03-17 MED ORDER — SENNOSIDES-DOCUSATE SODIUM 8.6-50 MG PO TABS
1.0000 | ORAL_TABLET | Freq: Two times a day (BID) | ORAL | Status: DC
  Administered 2022-03-17 – 2022-03-19 (×6): 1 via ORAL
  Filled 2022-03-17 (×6): qty 1

## 2022-03-17 MED ORDER — FLEET ENEMA 7-19 GM/118ML RE ENEM: 1.0000 | ENEMA | Freq: Once | RECTAL | Status: DC

## 2022-03-17 MED ORDER — SODIUM CHLORIDE 0.45 % IV SOLN
INTRAVENOUS | Status: AC
  Filled 2022-03-17 (×7): qty 75

## 2022-03-17 MED ORDER — OXYCODONE HCL 5 MG PO TABS
5.0000 mg | ORAL_TABLET | Freq: Four times a day (QID) | ORAL | Status: DC | PRN
  Administered 2022-03-17 – 2022-03-20 (×11): 5 mg via ORAL
  Filled 2022-03-17 (×11): qty 1

## 2022-03-17 NOTE — Consult Note (Signed)
[facility]  CONSULT NOTE    Date: 03/17/2022                  Patient Name:  Jordan Santana  MRN: [id number]  DOB: 11-15-1950  Age / Sex: 71 y.o., male         PCP: [name], MD                 Service Requesting Consult: [facility]                 Reason for Consult: Acute kidney injury            History of Present Illness: Mr. Jordan Santana is a 71 y.o.  male with past medical concerns of hypertension, diabetes, hyperlipidemia, and recurrent rhabdomyolysis, who was admitted to [facility] on 03/14/2022 for Abnormal laboratory test [R89.9] Elevated CK [R74.8] Non-traumatic rhabdomyolysis [M62.82] Acute kidney injury (AKI) with acute tubular necrosis (ATN) ([place]) [N17.0]  Patient presents to the emergency department with muscle pain in his lower extremities.  Patient states he has experienced rhabdomyolysis 4 times previously.  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  Denies triggers of medication, exercise, or diet.  States he can remember having the same pain as a child growing up, unable to ambulate.  Denies known fever or chills.  Denies consistent NSAID use.  Denies any chemical exposure triggers related to work.  Labs on ED arrival significant for BUN 31, creatinine 1.3 with GFR 59, AST 660, ALT 208, CK greater than 50,000.  UA appears hazy with hematuria.  Urine drug screen negative.  Abdominal ultrasound negative.  Medications: Outpatient medications: Medications Prior to Admission  Medication Sig Dispense Refill Last Dose   aspirin EC 81 MG tablet Take 81 mg by mouth daily.   03/13/2022   FARXIGA 10 MG TABS tablet Take 10 mg by mouth daily.   03/13/2022   ibuprofen (ADVIL,MOTRIN) 800 MG tablet Take 800 mg by mouth every 8 (eight) hours as needed.   prn at prn   irbesartan-hydrochlorothiazide (AVALIDE) 150-12.5 MG tablet Take 1  tablet by mouth daily.   03/13/2022   metFORMIN (GLUCOPHAGE) 1000 MG tablet Take 1,000 mg by mouth 2 (two) times daily.   03/13/2022   pioglitazone (ACTOS) 45 MG tablet Take 45 mg by mouth daily.   03/13/2022   traMADol (ULTRAM) 50 MG tablet Take 50 mg by mouth every 6 (six) hours as needed.   prn at prn   Canagliflozin-metFORMIN HCl ER [phone] MG TB24 Take 150-1,000 mg by mouth. (Patient not taking: Reported on 03/14/2022)   Not Taking   icosapent Ethyl (VASCEPA) 1 g capsule Take 2 g by mouth 2 (two) times daily. (Patient not taking: Reported on 03/16/2022)   Not Taking   irbesartan (AVAPRO) 150 MG tablet Take 150 mg by mouth daily. (Patient not taking: Reported on 03/14/2022)   Not Taking   polyethylene glycol-electrolytes (NULYTELY/GOLYTELY) 420 g solution Take 4,000 mLs by mouth once. (Patient not taking: Reported on 03/14/2022)   Not Taking   Semaglutide (RYBELSUS) 7 MG TABS Take 1 tablet by mouth daily. Do not cut, crush or chew (Patient not taking: Reported on 03/14/2022)   Not Taking   tadalafil (ADCIRCA/CIALIS) 20 MG tablet Take 20 mg by mouth daily as needed for erectile dysfunction. (Patient not taking: Reported on  03/14/2022)   Not Taking    Current medications: Current Facility-Administered Medications  Medication Dose Route Frequency Provider Last Rate Last Admin   aspirin EC tablet 81 mg  81 mg Oral Daily Para Skeans, MD   81 mg at 03/17/22 0943   heparin injection 5,000 Units  5,000 Units Subcutaneous Q8H Florina Ou V, MD   5,000 Units at 03/17/22 0532   hydrALAZINE (APRESOLINE) injection 10 mg  10 mg Intravenous Q6H PRN Para Skeans, MD       insulin aspart (novoLOG) injection 0-15 Units  0-15 Units Subcutaneous TID WC Para Skeans, MD   2 Units at 03/16/22 1637   morphine (PF) 2 MG/ML injection 2 mg  2 mg Intravenous Q4H PRN Para Skeans, MD   2 mg at 03/17/22 0413   oxyCODONE (Oxy IR/ROXICODONE) immediate release tablet 5 mg  5 mg Oral Q6H PRN Rai, Ripudeep K, MD   5 mg  at 03/17/22 1247   polyethylene glycol (MIRALAX / GLYCOLAX) packet 17 g  17 g Oral Daily Rai, Ripudeep K, MD   17 g at 03/17/22 0943   senna-docusate (Senokot-S) tablet 1 tablet  1 tablet Oral BID Rai, Ripudeep K, MD   1 tablet at 03/17/22 0943   sodium bicarbonate 75 mEq in sodium chloride 0.45 % 1,075 mL infusion   Intravenous Continuous Colon Flattery, NP 200 mL/hr at 03/17/22 1229 Rate Change at 03/17/22 1229   sodium chloride flush (NS) 0.9 % injection 3 mL  3 mL Intravenous Q12H Para Skeans, MD   3 mL at 03/16/22 0921   sodium phosphate (FLEET) 7-19 GM/118ML enema 1 enema  1 enema Rectal Once Rai, Vernelle Emerald, MD          Allergies: Allergies  Allergen Reactions   Bee Venom Hives    HONEY BEE   Biaxin [Clarithromycin] Rash      Past Medical History: Past Medical History:  Diagnosis Date   Diabetes mellitus without complication ([place])    Dysrhythmia    Hypercholesteremia    Hypertension      Past Surgical History: Past Surgical History:  Procedure Laterality Date   ABSESS TONGUE     COLONOSCOPY WITH PROPOFOL N/A 11/06/2017   Procedure: COLONOSCOPY WITH PROPOFOL;  Surgeon: Toledo, Benay Pike, MD;  Location: [facility] ENDOSCOPY;  Service: Gastroenterology;  Laterality: N/A;   COLONOSCOPY WITH PROPOFOL N/A 02/04/2021   Procedure: COLONOSCOPY WITH PROPOFOL;  Surgeon: Lesly Rubenstein, MD;  Location: [facility] ENDOSCOPY;  Service: Endoscopy;  Laterality: N/A;  DM   TUMOR ON FINGER     REMOVED     Family History: History reviewed. No pertinent family history.   Social History: Social History   Socioeconomic History   Marital status: Married    Spouse name: Not on file   Number of children: Not on file   Years of education: Not on file   Highest education level: Not on file  Occupational History   Not on file  Tobacco Use   Smoking status: Never   Smokeless tobacco: Never  Vaping Use   Vaping Use: Never used  Substance and Sexual Activity   Alcohol use: Yes     Comment: rarekt   Drug use: Never   Sexual activity: Not on file  Other Topics Concern   Not on file  Social History Narrative   Not on file   Social Determinants of Health   Financial Resource Strain: Not on file  Food Insecurity: No  Food Insecurity (03/14/2022)   Hunger Vital Sign    Worried About Running Out of Food in the Last Year: Never true    Ran Out of Food in the Last Year: Never true  Transportation Needs: No Transportation Needs (03/14/2022)   PRAPARE - Hydrologist (Medical): No    Lack of Transportation (Non-Medical): No  Physical Activity: Not on file  Stress: Not on file  Social Connections: Not on file  Intimate Partner Violence: Not At Risk (03/14/2022)   Humiliation, Afraid, Rape, and Kick questionnaire    Fear of Current or Ex-Partner: No    Emotionally Abused: No    Physically Abused: No    Sexually Abused: No     Review of Systems: Review of Systems  Constitutional:  Negative for chills, fever and malaise/fatigue.  HENT:  Negative for congestion, sore throat and tinnitus.   Eyes:  Negative for blurred vision and redness.  Respiratory:  Negative for cough, shortness of breath and wheezing.   Cardiovascular:  Negative for chest pain, palpitations, claudication and leg swelling.  Gastrointestinal:  Negative for abdominal pain, blood in stool, diarrhea, nausea and vomiting.  Genitourinary:  Negative for flank pain, frequency and hematuria.  Musculoskeletal:  Positive for myalgias. Negative for back pain and falls.  Skin:  Negative for rash.  Neurological:  Negative for dizziness, weakness and headaches.  Endo/Heme/Allergies:  Does not bruise/bleed easily.  Psychiatric/Behavioral:  Negative for depression. The patient is not nervous/anxious and does not have insomnia.     Vital Signs: Blood pressure 117/68, pulse 64, temperature 98 F (36.7 C), temperature source Oral, resp. rate 16, weight 119.2 kg, SpO2 99 %.  Weight  trends: Filed Weights   03/15/22 0503 03/16/22 0453 03/17/22 0318  Weight: 117.5 kg 117.3 kg 119.2 kg    Physical Exam: General: NAD, resting comfortably  Head: Normocephalic, atraumatic. Moist oral mucosal membranes  Eyes: Anicteric  Lungs:  Clear to auscultation, normal effort, room air  Heart: Regular rate and rhythm  Abdomen:  Soft, nontender, nondistended  Extremities: No peripheral edema.  Neurologic: Nonfocal, moving all four extremities  Skin: No lesions  Access: None     Lab results: Basic Metabolic Panel: Recent Labs  Lab 03/15/22 0037 03/16/22 0629 03/17/22 0617  NA 137 140 142  K 4.4 4.4 4.2  CL 105 104 106  CO2 '27 30 28  '$ GLUCOSE 133* 116* 115*  BUN 29* 21 17  CREATININE 1.28* 1.10 1.12  CALCIUM 8.5* 8.7* 8.6*  MG 2.5*  --   --     Liver Function Tests: Recent Labs  Lab 03/15/22 0037 03/16/22 0629 03/17/22 0617  AST 521* 500* 431*  ALT 166* 168* 166*  ALKPHOS 37* 37* 37*  BILITOT 0.7 0.7 0.6  PROT 5.7* 5.8* 5.4*  ALBUMIN 3.3* 3.3* 2.9*   No results for input(s): "LIPASE", "AMYLASE" in the last 168 hours. No results for input(s): "AMMONIA" in the last 168 hours.  CBC: Recent Labs  Lab 03/14/22 1809 03/15/22 0037  WBC 8.9 7.2  NEUTROABS 5.1  --   HGB 14.4 13.0  HCT 45.8 41.5  MCV 93.1 93.0  PLT 288 245    Cardiac Enzymes: Recent Labs  Lab 03/15/22 2250 03/16/22 0629 03/16/22 1449 03/16/22 2225 03/17/22 0616  CKTOTAL >50,000* >50,000* >50,000* >50,000* 48,947*    BNP: Invalid input(s): "POCBNP"  CBG: Recent Labs  Lab 03/16/22 1126 03/16/22 1632 03/16/22 2058 03/17/22 0753 03/17/22 1144  GLUCAP 110* 136* 91 87  132*    Microbiology: No results found for this or any previous visit.  Coagulation Studies: No results for input(s): "LABPROT", "INR" in the last 72 hours.  Urinalysis: Recent Labs    03/14/22 1817  COLORURINE AMBER*  LABSPEC 1.022  PHURINE 5.0  GLUCOSEU >=500*  HGBUR LARGE*  BILIRUBINUR NEGATIVE   KETONESUR NEGATIVE  PROTEINUR 100*  NITRITE NEGATIVE  LEUKOCYTESUR NEGATIVE      Imaging: No results found.   Assessment & Plan: Mr. UNNAMED ZEIEN is a 71 y.o.  male with past medical concerns of hypertension, diabetes, hyperlipidemia, and recurrent rhabdomyolysis, who was admitted to [facility] on 03/14/2022 for Abnormal laboratory test [R89.9] Elevated CK [R74.8] Non-traumatic rhabdomyolysis [M62.82] Acute kidney injury (AKI) with acute tubular necrosis (ATN) ([facility]) [N17.0]  Acute kidney injury likely secondary to rhabdomyolysis.  Creatinine on admission 1.3.  Creatinine within normal limits now.  Agree with high rate IVFs, will increase to sodium bicarb 265m/hr and patient encouraged to maintain oral intake.  Patient also encouraged to rest, out of bed to bathroom only, to minimize further muscle breakdown.  Expect urine to appear dark due to myoglobin, this should improve as the CK level improves.  CK today down to 48,000.  We will continue to monitor.  2.  Hypertension with chronic kidney disease.  Event includes irbesartan-hydrochlorothiazide.  Currently held.  3. Diabetes mellitus type II, noninsulin dependent. Home regimen includes metformin and Farxiga.  Both currently held due to kidney injury.  Most recent hemoglobin A1c is 6.4 on 03/14/22.       LOS: 3Hominy11/3/20231:54 PM

## 2022-03-17 NOTE — Care Management Important Message (Signed)
Important Message  Patient Details  Name: Jordan Santana MRN: [id number] Date of Birth: 1951-05-10   Medicare Important Message Given:  Yes     Dannette Barbara 03/17/2022, 11:29 AM

## 2022-03-17 NOTE — Progress Notes (Signed)
[facility]                                                                              Jordan Santana, is a 71 y.o. male, DOB - 08/05/1950, CBJ:[id number] Admit date - 03/14/2022    Outpatient Primary MD for the patient is [name], MD  LOS - 3  days  No chief complaint on file.      Brief summary   Patient is a 71 year old male with history of recurrent rhabdomyolysis, hypertension, hyperlipidemia, diabetes mellitus presented with myalgias and muscle aches affecting his thighs, pelvic girdle and upper shoulders.  Patient reported that he has been having aches and has had 4 prior episodes of rhabdomyolysis. In ED, patient was found to have mild AKI, creatinine 1.3, no baseline available CK> for 2000 on admission, LDH 2597 AST 660, ALT 208, alk phos 47, total bilirubin 0.7   Assessment & Plan    Principal Problem:   AKI (acute kidney injury) ([place]) -Likely due to acute rhabdomyolysis -Creatinine 1.3 on admission, -Currently on aggressive IV fluid hydration, creatinine normalized to 1.1 -Continue to hold irbesartan  Active Problems:    Non-traumatic rhabdomyolysis, recurrent -Likely precipitated due to COVID 2 weeks ago -Per patient, this is his fifth episode of rhabdomyolysis, has had evaluation in the past with no clear etiology found -Neuro consulted, unlikely to be inflammatory in nature, episodes spread over the course of 40 years would argue against an autoimmune cause, recommended to follow-up with neuromuscular specialist outpatient.   - Likely will need to follow-up with rheumatology outpatient -CK>50,000 on admission, now improving, [phone] today -Nephrology was consulted on 11/2, d/w Dr. Holley Raring, plan to evaluate today  Transaminitis -Likely due to rhabdomyolysis, LFTs improving -Abdominal ultrasound normal    Essential hypertension -BP stable, continue IV hydralazine as needed     DM II (diabetes mellitus, type II), controlled  ([place]) -Hold semaglutide, metformin, canagliflozin, Actos -Continue sliding scale insulin while inpatient HbA1c 6.4 Recent Labs    03/16/22 0829 03/16/22 1126 03/16/22 1632 03/16/22 2058 03/17/22 0753 03/17/22 1144  GLUCAP 100* 110* 136* 91 87 132*      Obesity Estimated body mass index is 42.42 kg/m as calculated from the following:   Height as of 02/04/21: _0  (1.676 m).   Weight as of this encounter: 119.2 kg.  Code Status: full  DVT Prophylaxis:  heparin injection 5,000 Units Start: 03/14/22 2300   Level of Care: Level of care: Telemetry Medical Family Communication: Updated patient's daughter on the phone   Disposition Plan:      Remains inpatient appropriate: CK still elevated, on IV fluids   Procedures:  Abdominal ultrasound  Consultants:   Neurology Nephrology  Antimicrobials: None     Medications  aspirin EC  81 mg Oral Daily   heparin  5,000 Units Subcutaneous Q8H   insulin aspart  0-15 Units Subcutaneous TID WC   polyethylene glycol  17 g Oral Daily   senna-docusate  1 tablet Oral BID   sodium chloride flush  3 mL Intravenous Q12H   sodium phosphate  1 enema Rectal Once      Subjective:  Jordan Santana was seen and examined today.  Overall feeling better today, no acute pain, has mild muscle aches.  No dizziness, chest pain, shortness of breath, nausea or vomiting.  Objective:   Vitals:   03/16/22 1930 03/17/22 0318 03/17/22 0415 03/17/22 0739  BP: 107/61  120/67 117/68  Pulse: 80  62 64  Resp: _0 Temp: 99.4 F (37.4 C)  99.1 F (37.3 C) 98 F (36.7 C)  TempSrc: Oral  Oral Oral  SpO2: 98%  97% 99%  Weight:  119.2 kg      Intake/Output Summary (Last 24 hours) at 03/17/2022 1307 Last data filed at 03/17/2022 1100 Gross per 24 hour  Intake 240 ml  Output 3550 ml  Net -3310 ml     Wt Readings from Last 3 Encounters:  03/17/22 119.2 kg  02/04/21 111.1 kg  11/06/17 113.4 kg    Physical Exam General: Alert and oriented  x 3, NAD Cardiovascular: S1 S2 clear, RRR.  Respiratory: CTAB, no wheezing Gastrointestinal: Soft, nontender, nondistended, NBS Ext: no pedal edema bilaterally Neuro: no new deficits Skin: No rashes Psych: Normal affect    Data Reviewed:  I have personally reviewed following labs    CBC Lab Results  Component Value Date   WBC 7.2 03/15/2022   RBC 4.46 03/15/2022   HGB 13.0 03/15/2022   HCT 41.5 03/15/2022   MCV 93.0 03/15/2022   MCH 29.1 03/15/2022   PLT 245 03/15/2022   MCHC 31.3 03/15/2022   RDW 13.6 03/15/2022   LYMPHSABS 3.0 03/14/2022   MONOABS 0.6 03/14/2022   EOSABS 0.1 03/14/2022   BASOSABS 0.0 45/80/9983     Last metabolic panel Lab Results  Component Value Date   NA 142 03/17/2022   K 4.2 03/17/2022   CL 106 03/17/2022   CO2 28 03/17/2022   BUN 17 03/17/2022   CREATININE 1.12 03/17/2022   GLUCOSE 115 (H) 03/17/2022   GFRNONAA >60 03/17/2022   GFRAA >60 07/28/2013   CALCIUM 8.6 (L) 03/17/2022   PROT 5.4 (L) 03/17/2022   ALBUMIN 2.9 (L) 03/17/2022   BILITOT 0.6 03/17/2022   ALKPHOS 37 (L) 03/17/2022   AST 431 (H) 03/17/2022   ALT 166 (H) 03/17/2022   ANIONGAP 8 03/17/2022    CBG (last 3)  Recent Labs    03/16/22 2058 03/17/22 0753 03/17/22 1144  GLUCAP 91 87 132*      Coagulation Profile: No results for input(s): "INR", "PROTIME" in the last 168 hours.   Radiology Studies: I have personally reviewed the imaging studies  No results found.     Estill Cotta M.D. [facility] 03/17/2022, 1:07 PM  Available via Epic secure chat 7am-7pm After 7 pm, please refer to night coverage provider listed on amion.

## 2022-03-18 DIAGNOSIS — M6282 Rhabdomyolysis: Secondary | ICD-10-CM | POA: Diagnosis not present

## 2022-03-18 DIAGNOSIS — N179 Acute kidney failure, unspecified: Secondary | ICD-10-CM | POA: Diagnosis not present

## 2022-03-18 DIAGNOSIS — R748 Abnormal levels of other serum enzymes: Secondary | ICD-10-CM | POA: Diagnosis not present

## 2022-03-18 LAB — COMPREHENSIVE METABOLIC PANEL
ALT: 156 U/L — ABNORMAL HIGH (ref 0–44)
AST: 297 U/L — ABNORMAL HIGH (ref 15–41)
Albumin: 2.9 g/dL — ABNORMAL LOW (ref 3.5–5.0)
Alkaline Phosphatase: 37 U/L — ABNORMAL LOW (ref 38–126)
Anion gap: 3 — ABNORMAL LOW (ref 5–15)
BUN: 16 mg/dL (ref 8–23)
CO2: 32 mmol/L (ref 22–32)
Calcium: 8.5 mg/dL — ABNORMAL LOW (ref 8.9–10.3)
Chloride: 106 mmol/L (ref 98–111)
Creatinine, Ser: 1.14 mg/dL (ref 0.61–1.24)
GFR, Estimated: >60 mL/min (ref >60)
Glucose, Bld: 124 mg/dL — ABNORMAL HIGH (ref 70–99)
Potassium: 4.4 mmol/L (ref 3.5–5.1)
Sodium: 141 mmol/L (ref 135–145)
Total Bilirubin: 0.4 mg/dL (ref 0.3–1.2)
Total Protein: 5.4 g/dL — ABNORMAL LOW (ref 6.5–8.1)

## 2022-03-18 LAB — CK
Total CK: 31912 U/L — ABNORMAL HIGH (ref 49–397)
Total CK: 23924 U/L — ABNORMAL HIGH (ref 49–397)
Total CK: 13490 U/L — ABNORMAL HIGH (ref 49–397)
Total CK: 21197 U/L — ABNORMAL HIGH (ref 49–397)

## 2022-03-18 LAB — GLUCOSE, CAPILLARY
Glucose-Capillary: 117 mg/dL — ABNORMAL HIGH (ref 70–99)
Glucose-Capillary: 130 mg/dL — ABNORMAL HIGH (ref 70–99)
Glucose-Capillary: 107 mg/dL — ABNORMAL HIGH (ref 70–99)
Glucose-Capillary: 105 mg/dL — ABNORMAL HIGH (ref 70–99)

## 2022-03-18 NOTE — Progress Notes (Signed)
[facility]                                                                              Jordan Santana, is a 71 y.o. male, DOB - 29-Nov-1950, LDJ:[id number] Admit date - 03/14/2022    Outpatient Primary MD for the patient is Jordan Marble, MD  LOS - 4  days  No chief complaint on file.      Brief summary   Patient is a 71 year old male with history of recurrent rhabdomyolysis, hypertension, hyperlipidemia, diabetes mellitus presented with myalgias and muscle aches affecting his thighs, pelvic girdle and upper shoulders.  Patient reported that he has been having aches and has had 4 prior episodes of rhabdomyolysis. In ED, patient was found to have mild AKI, creatinine 1.3, no baseline available CK>50,000 on admission, LDH 2597 AST 660, ALT 208, alk phos 47, total bilirubin 0.7   Assessment & Plan    Principal Problem:   AKI (acute kidney injury) ([place]) -Likely due to acute rhabdomyolysis -Creatinine 1.3 on admission, -Continue aggressive IV fluid hydration for rhabdomyolysis, creatinine has normalized 1.1  Active Problems:    Non-traumatic rhabdomyolysis, recurrent -Likely precipitated due to COVID 2 weeks ago -Per patient, this is his fifth episode of rhabdomyolysis, has had evaluation in the past with no clear etiology found -Neuro consulted, unlikely to be inflammatory in nature, episodes spread over the course of 40 years would argue against an autoimmune cause, recommended to follow-up with neuromuscular specialist outpatient.   - Likely will need to follow-up with rheumatology outpatient -CK>50,000 on admission, improving, 23,924 today -Nephrology was consulted on 11/2, increased IV fluids and recommended vigorous IV fluid hydration  Transaminitis -Likely due to rhabdomyolysis, LFTs improving -Abdominal ultrasound normal    Essential hypertension -BP stable, continue IV hydralazine as needed     DM II (diabetes mellitus, type II), controlled  ([place]) -Hold semaglutide, metformin, canagliflozin, Actos -Continue sliding scale insulin while inpatient HbA1c 6.4 -CBG stable  Obesity Estimated body mass index is 43.91 kg/m as calculated from the following:   Height as of 02/04/21: _0  (1.676 m).   Weight as of this encounter: 123.4 kg.  Code Status: full  DVT Prophylaxis:  heparin injection 5,000 Units Start: 03/14/22 2300   Level of Care: Level of care: Telemetry Medical Family Communication: Updated patient   Disposition Plan:      Remains inpatient appropriate: CK still elevated, on IV fluids, hopefully CK will continue to trend down and we can get him home safely tomorrow   Procedures:  Abdominal ultrasound  Consultants:   Neurology Nephrology  Antimicrobials: None     Medications  aspirin EC  81 mg Oral Daily   heparin  5,000 Units Subcutaneous Q8H   insulin aspart  0-15 Units Subcutaneous TID WC   polyethylene glycol  17 g Oral Daily   senna-docusate  1 tablet Oral BID   sodium chloride flush  3 mL Intravenous Q12H   sodium phosphate  1 enema Rectal Once      Subjective:   Jordan Santana was seen and examined today.  Overall improving, no acute complaints, myalgias improving.  No dizziness,  chest pain, shortness of breath   Objective:   Vitals:   03/17/22 1516 03/17/22 1926 03/18/22 0453 03/18/22 0823  BP: 121/68 113/67 117/72 121/75  Pulse: 89 75 63 65  Resp: _0 Temp: 98.2 F (36.8 C) 98.2 F (36.8 C) 98.1 F (36.7 C) 98 F (36.7 C)  TempSrc: Oral Oral Oral Oral  SpO2: 98% 97% 97% 98%  Weight:   123.4 kg     Intake/Output Summary (Last 24 hours) at 03/18/2022 1136 Last data filed at 03/18/2022 0700 Gross per 24 hour  Intake 1309.36 ml  Output 3850 ml  Net -2540.64 ml     Wt Readings from Last 3 Encounters:  03/18/22 123.4 kg  02/04/21 111.1 kg  11/06/17 113.4 kg    Physical Exam General: Alert and oriented x 3, NAD Cardiovascular: S1 S2 clear, RRR.  Respiratory:  CTAB, no wheezing, rales  Gastrointestinal: Soft, nontender, nondistended, NBS Ext: no pedal edema bilaterally Neuro: no new deficits Skin: No rashes Psych: Normal affect and demeanor  Data Reviewed:  I have personally reviewed following labs    CBC Lab Results  Component Value Date   WBC 7.2 03/15/2022   RBC 4.46 03/15/2022   HGB 13.0 03/15/2022   HCT 41.5 03/15/2022   MCV 93.0 03/15/2022   MCH 29.1 03/15/2022   PLT 245 03/15/2022   MCHC 31.3 03/15/2022   RDW 13.6 03/15/2022   LYMPHSABS 3.0 03/14/2022   MONOABS 0.6 03/14/2022   EOSABS 0.1 03/14/2022   BASOSABS 0.0 30/01/2329     Last metabolic panel Lab Results  Component Value Date   NA 141 03/18/2022   K 4.4 03/18/2022   CL 106 03/18/2022   CO2 32 03/18/2022   BUN 16 03/18/2022   CREATININE 1.14 03/18/2022   GLUCOSE 124 (H) 03/18/2022   GFRNONAA >60 03/18/2022   GFRAA >60 07/28/2013   CALCIUM 8.5 (L) 03/18/2022   PROT 5.4 (L) 03/18/2022   ALBUMIN 2.9 (L) 03/18/2022   BILITOT 0.4 03/18/2022   ALKPHOS 37 (L) 03/18/2022   AST 297 (H) 03/18/2022   ALT 156 (H) 03/18/2022   ANIONGAP 3 (L) 03/18/2022    CBG (last 3)  Recent Labs    03/17/22 1623 03/17/22 2100 03/18/22 0815  GLUCAP 172* 136* 117*      Coagulation Profile: No results for input(s): "INR", "PROTIME" in the last 168 hours.   Radiology Studies: I have personally reviewed the imaging studies  No results found.     Estill Cotta M.D. [facility] 03/18/2022, 11:36 AM  Available via Epic secure chat 7am-7pm After 7 pm, please refer to night coverage provider listed on amion.

## 2022-03-18 NOTE — Progress Notes (Signed)
  [facility]  PROGRESS NOTE   Subjective:   Has been feeling much better today. Urine output is excellent.  Objective:  Vital signs: Blood pressure 121/75, pulse 65, temperature 98 F (36.7 C), temperature source Oral, resp. rate 18, weight 123.4 kg, SpO2 98 %.  Intake/Output Summary (Last 24 hours) at 03/18/2022 1343 Last data filed at 03/18/2022 0700 Gross per 24 hour  Intake 949.36 ml  Output 3850 ml  Net -2900.64 ml   Filed Weights   03/16/22 0453 03/17/22 0318 03/18/22 0453  Weight: 117.3 kg 119.2 kg 123.4 kg     Physical Exam: General:  No acute distress  Head:  Normocephalic, atraumatic. Moist oral mucosal membranes  Eyes:  Anicteric  Neck:  Supple  Lungs:   Clear to auscultation, normal effort  Heart:  S1S2 no rubs  Abdomen:   Soft, nontender, bowel sounds present  Extremities:  peripheral edema.  Neurologic:  Awake, alert, following commands  Skin:  No lesions  Access:     Basic Metabolic Panel: Recent Labs  Lab 03/14/22 1809 03/15/22 0037 03/16/22 0629 03/17/22 0617 03/18/22 0605  NA 138 137 140 142 141  K 4.3 4.4 4.4 4.2 4.4  CL 101 105 104 106 106  CO2 '29 27 30 28 '$ 32  GLUCOSE 118* 133* 116* 115* 124*  BUN 31* 29* '21 17 16  '$ CREATININE 1.30* 1.28* 1.10 1.12 1.14  CALCIUM 9.3 8.5* 8.7* 8.6* 8.5*  MG  --  2.5*  --   --   --     CBC: Recent Labs  Lab 03/14/22 1809 03/15/22 0037  WBC 8.9 7.2  NEUTROABS 5.1  --   HGB 14.4 13.0  HCT 45.8 41.5  MCV 93.1 93.0  PLT 288 245     Urinalysis: No results for input(s): "COLORURINE", "LABSPEC", "PHURINE", "GLUCOSEU", "HGBUR", "BILIRUBINUR", "KETONESUR", "PROTEINUR", "UROBILINOGEN", "NITRITE", "LEUKOCYTESUR" in the last 72 hours.  Invalid input(s): "APPERANCEUR"    Imaging: No results found.   Medications:     aspirin EC  81 mg Oral Daily   heparin  5,000 Units Subcutaneous Q8H   insulin aspart  0-15 Units Subcutaneous TID WC   polyethylene glycol  17 g Oral Daily    senna-docusate  1 tablet Oral BID   sodium chloride flush  3 mL Intravenous Q12H   sodium phosphate  1 enema Rectal Once    Assessment/ Plan:     Principal Problem:   AKI (acute kidney injury) ([place]) Active Problems:   Essential hypertension   DM II (diabetes mellitus, type II), controlled ([place])   Non-traumatic rhabdomyolysis   Transaminitis   Acute kidney injury (AKI) with acute tubular necrosis (ATN) ([place])  71 year old male with history of hypertension, diabetes, hyperlipidemia, history of myalgias now admitted with history of recurrent rhabdomyolysis, elevated liver enzymes and acute kidney injury.  #1: Acute kidney injury: Renal indices have improved to normal levels.  #2: Rhabdomyolysis: CPK levels have been improving steadily.  Continue the IV fluids and monitor urine output closely.  Patient needs rheumatological evaluation.  #3: Elevated liver enzymes: Have been improving.  #4: Diabetes: Diet advised.  Continue insulin as ordered.  #5: Hypertension: Antihypertensives on hold.  Advised on importance of 2 g salt restricted diet.  Spoke to the patient and his wife at bedside.    LOS: Start, MD [facility] 11/4/20231:43 PM

## 2022-03-19 DIAGNOSIS — M6282 Rhabdomyolysis: Secondary | ICD-10-CM | POA: Diagnosis not present

## 2022-03-19 DIAGNOSIS — N179 Acute kidney failure, unspecified: Secondary | ICD-10-CM | POA: Diagnosis not present

## 2022-03-19 DIAGNOSIS — R748 Abnormal levels of other serum enzymes: Secondary | ICD-10-CM | POA: Diagnosis not present

## 2022-03-19 LAB — COMPREHENSIVE METABOLIC PANEL
ALT: 157 U/L — ABNORMAL HIGH (ref 0–44)
AST: 223 U/L — ABNORMAL HIGH (ref 15–41)
Albumin: 3 g/dL — ABNORMAL LOW (ref 3.5–5.0)
Alkaline Phosphatase: 39 U/L (ref 38–126)
Anion gap: 4 — ABNORMAL LOW (ref 5–15)
BUN: 18 mg/dL (ref 8–23)
CO2: 29 mmol/L (ref 22–32)
Calcium: 9 mg/dL (ref 8.9–10.3)
Chloride: 109 mmol/L (ref 98–111)
Creatinine, Ser: 1.12 mg/dL (ref 0.61–1.24)
GFR, Estimated: >60 mL/min (ref >60)
Glucose, Bld: 116 mg/dL — ABNORMAL HIGH (ref 70–99)
Potassium: 4.2 mmol/L (ref 3.5–5.1)
Sodium: 142 mmol/L (ref 135–145)
Total Bilirubin: 0.5 mg/dL (ref 0.3–1.2)
Total Protein: 5.5 g/dL — ABNORMAL LOW (ref 6.5–8.1)

## 2022-03-19 LAB — GLUCOSE, CAPILLARY
Glucose-Capillary: 122 mg/dL — ABNORMAL HIGH (ref 70–99)
Glucose-Capillary: 112 mg/dL — ABNORMAL HIGH (ref 70–99)
Glucose-Capillary: 92 mg/dL (ref 70–99)
Glucose-Capillary: 106 mg/dL — ABNORMAL HIGH (ref 70–99)

## 2022-03-19 LAB — CK
Total CK: 14943 U/L — ABNORMAL HIGH (ref 49–397)
Total CK: 11208 U/L — ABNORMAL HIGH (ref 49–397)
Total CK: 9863 U/L — ABNORMAL HIGH (ref 49–397)

## 2022-03-19 MED ORDER — POLYETHYLENE GLYCOL 3350 17 G PO PACK
17.0000 g | PACK | Freq: Two times a day (BID) | ORAL | Status: DC
  Administered 2022-03-19 – 2022-03-20 (×2): 17 g via ORAL
  Filled 2022-03-19 (×2): qty 1

## 2022-03-19 MED ORDER — SODIUM BICARBONATE 8.4 % IV SOLN
INTRAVENOUS | Status: DC
  Filled 2022-03-19 (×3): qty 1000

## 2022-03-19 MED ORDER — STERILE WATER FOR INJECTION IV SOLN
INTRAVENOUS | Status: DC
  Filled 2022-03-19: qty 1000
  Filled 2022-03-19: qty 150
  Filled 2022-03-19: qty 1000
  Filled 2022-03-19 (×3): qty 150
  Filled 2022-03-19 (×2): qty 1000

## 2022-03-19 NOTE — Progress Notes (Signed)
[facility]  PROGRESS NOTE   Subjective:   Patient seen at bedside.  Feels much better today.  Urine output is excellent.  Objective:  Vital signs: Blood pressure 125/65, pulse (!) 58, temperature 98.4 F (36.9 C), temperature source Oral, resp. rate 14, weight 120.4 kg, SpO2 98 %.  Intake/Output Summary (Last 24 hours) at 03/19/2022 1318 Last data filed at 03/19/2022 1309 Gross per 24 hour  Intake 386.81 ml  Output 3300 ml  Net -2913.19 ml   Filed Weights   03/17/22 0318 03/18/22 0453 03/19/22 0520  Weight: 119.2 kg 123.4 kg 120.4 kg     Physical Exam: General:  No acute distress  Head:  Normocephalic, atraumatic. Moist oral mucosal membranes  Eyes:  Anicteric  Neck:  Supple  Lungs:   Clear to auscultation, normal effort  Heart:  S1S2 no rubs  Abdomen:   Soft, nontender, bowel sounds present  Extremities:  peripheral edema.  Neurologic:  Awake, alert, following commands  Skin:  No lesions  Access:     Basic Metabolic Panel: Recent Labs  Lab 03/15/22 0037 03/16/22 0629 03/17/22 0617 03/18/22 0605 03/19/22 0517  NA 137 140 142 141 142  K 4.4 4.4 4.2 4.4 4.2  CL 105 104 106 106 109  CO2 '27 30 28 '$ 32 29  GLUCOSE 133* 116* 115* 124* 116*  BUN 29* '21 17 16 18  '$ CREATININE 1.28* 1.10 1.12 1.14 1.12  CALCIUM 8.5* 8.7* 8.6* 8.5* 9.0  MG 2.5*  --   --   --   --     CBC: Recent Labs  Lab 03/14/22 1809 03/15/22 0037  WBC 8.9 7.2  NEUTROABS 5.1  --   HGB 14.4 13.0  HCT 45.8 41.5  MCV 93.1 93.0  PLT 288 245     Urinalysis: No results for input(s): "COLORURINE", "LABSPEC", "PHURINE", "GLUCOSEU", "HGBUR", "BILIRUBINUR", "KETONESUR", "PROTEINUR", "UROBILINOGEN", "NITRITE", "LEUKOCYTESUR" in the last 72 hours.  Invalid input(s): "APPERANCEUR"    Imaging: No results found.   Medications:    sodium bicarbonate 150 mEq in sterile water 1,150 mL infusion 200 mL/hr at 03/19/22 1309    aspirin EC  81 mg Oral Daily   heparin  5,000 Units  Subcutaneous Q8H   insulin aspart  0-15 Units Subcutaneous TID WC   polyethylene glycol  17 g Oral BID   senna-docusate  1 tablet Oral BID   sodium chloride flush  3 mL Intravenous Q12H   sodium phosphate  1 enema Rectal Once    Assessment/ Plan:     Principal Problem:   AKI (acute kidney injury) ([place]) Active Problems:   Essential hypertension   DM II (diabetes mellitus, type II), controlled ([place])   Non-traumatic rhabdomyolysis   Transaminitis   Acute kidney injury (AKI) with acute tubular necrosis (ATN) ([place])  71 year old male with history of hypertension, diabetes, hyperlipidemia, history of myalgias now admitted with history of recurrent rhabdomyolysis, elevated liver enzymes and acute kidney injury.   #1: Acute kidney injury: Renal indices have improved to normal levels.   #2: Rhabdomyolysis: CPK levels have been improving steadily.  He has been on Rybelsus which can cause rhabdomyolysis.  Continue the IV fluids and monitor urine output closely.  Patient needs rheumatological evaluation.   #3: Elevated liver enzymes: Have been improving.   #4: Diabetes: Diet advised.  Continue insulin as ordered.   #5: Hypertension: Antihypertensives on hold.  Advised on importance of 2 g salt restricted diet.   Spoke to the patient and his wife  at bedside.   LOS: [place] 11/5/20231:18 PM

## 2022-03-19 NOTE — Progress Notes (Signed)
[facility]                                                                              Jordan Santana, is a 71 y.o. male, DOB - [birth year], ZLD:[id number] Admit date - 03/14/2022    Outpatient Primary MD for the patient is [name], MD  LOS - 4  days  No chief complaint on file.      Brief summary   Patient is a 71 year old male with history of recurrent rhabdomyolysis, hypertension, hyperlipidemia, diabetes mellitus presented with myalgias and muscle aches affecting his thighs, pelvic girdle and upper shoulders.  Patient reported that he has been having aches and has had 4 prior episodes of rhabdomyolysis. In ED, patient was found to have mild AKI, creatinine 1.3, no baseline available CK>50,000 on admission, LDH 2597 AST 660, ALT 208, alk phos 47, total bilirubin 0.7   Assessment & Plan    Principal Problem:   AKI (acute kidney injury) ([place]) -Likely due to acute rhabdomyolysis -Creatinine 1.3 on admission, -IV fluids restarted early this morning, creatinine normal at 1.1   Active Problems:    Non-traumatic rhabdomyolysis, recurrent -Likely precipitated due to COVID 2 weeks ago -Per patient, this is his fifth episode of rhabdomyolysis, has had evaluation in the past with no clear etiology found -Neuro consulted, unlikely to be inflammatory in nature, episodes spread over the course of 40 years would argue against an autoimmune cause, recommended to follow-up with neuromuscular specialist outpatient.   - Likely will need to follow-up with rheumatology outpatient -Nephrology consulted, recommended increased decrease IVF -CK>50,000 on admission, improved to 13,490 yesterday then increased again to 21,197 -IV fluids started again.  Repeat CK is trending down  Transaminitis -Likely due to rhabdomyolysis, LFTs improving -Abdominal ultrasound normal    Essential hypertension -BP stable, continue IV hydralazine as needed     DM II (diabetes mellitus,  type II), controlled ([place]) -Hold semaglutide, metformin, canagliflozin, Actos -Rybelsus can cause rhabdomyolysis, will discontinue on discharge -Continue sliding scale insulin while inpatient HbA1c 6.4 -CBG stable  Obesity Estimated body mass index is 43.91 kg/m as calculated from the following:   Height as of 02/04/21: _0  (1.676 m).   Weight as of this encounter: 123.4 kg.  Code Status: full  DVT Prophylaxis:  heparin injection 5,000 Units Start: 03/14/22 2300   Level of Care: Level of care: Telemetry Medical Family Communication: Updated patient   Disposition Plan:      Remains inpatient appropriate: CK still elevated, received IV fluids, hopefully will DC home tomorrow if continues to improve   Procedures:  Abdominal ultrasound  Consultants:   Neurology Nephrology  Antimicrobials: None     Medications  aspirin EC  81 mg Oral Daily   heparin  5,000 Units Subcutaneous Q8H   insulin aspart  0-15 Units Subcutaneous TID WC   polyethylene glycol  17 g Oral Daily   senna-docusate  1 tablet Oral BID   sodium chloride flush  3 mL Intravenous Q12H   sodium phosphate  1 enema Rectal Once      Subjective:   Jordan Santana was seen and examined today.  No acute complaints.  No chest pain or shortness of breath, no peripheral edema Objective:   Vitals:   03/17/22 1516 03/17/22 1926 03/18/22 0453 03/18/22 0823  BP: 121/68 113/67 117/72 121/75  Pulse: 89 75 63 65  Resp: _0 Temp: 98.2 F (36.8 C) 98.2 F (36.8 C) 98.1 F (36.7 C) 98 F (36.7 C)  TempSrc: Oral Oral Oral Oral  SpO2: 98% 97% 97% 98%  Weight:   123.4 kg     Intake/Output Summary (Last 24 hours) at 03/18/2022 1136 Last data filed at 03/18/2022 0700 Gross per 24 hour  Intake 1309.36 ml  Output 3850 ml  Net -2540.64 ml     Wt Readings from Last 3 Encounters:  03/18/22 123.4 kg  02/04/21 111.1 kg  11/06/17 113.4 kg   Physical Exam General: Alert and oriented x 3, NAD Cardiovascular:  S1 S2 clear, RRR.  Respiratory: CTAB, no wheezing, rales  Gastrointestinal: Soft, nontender, nondistended, NBS Ext: no pedal edema bilaterally  Data Reviewed:  I have personally reviewed following labs    CBC Lab Results  Component Value Date   WBC 7.2 03/15/2022   RBC 4.46 03/15/2022   HGB 13.0 03/15/2022   HCT 41.5 03/15/2022   MCV 93.0 03/15/2022   MCH 29.1 03/15/2022   PLT 245 03/15/2022   MCHC 31.3 03/15/2022   RDW 13.6 03/15/2022   LYMPHSABS 3.0 03/14/2022   MONOABS 0.6 03/14/2022   EOSABS 0.1 03/14/2022   BASOSABS 0.0 23/36/1224     Last metabolic panel Lab Results  Component Value Date   NA 141 03/18/2022   K 4.4 03/18/2022   CL 106 03/18/2022   CO2 32 03/18/2022   BUN 16 03/18/2022   CREATININE 1.14 03/18/2022   GLUCOSE 124 (H) 03/18/2022   GFRNONAA >60 03/18/2022   GFRAA >60 07/28/2013   CALCIUM 8.5 (L) 03/18/2022   PROT 5.4 (L) 03/18/2022   ALBUMIN 2.9 (L) 03/18/2022   BILITOT 0.4 03/18/2022   ALKPHOS 37 (L) 03/18/2022   AST 297 (H) 03/18/2022   ALT 156 (H) 03/18/2022   ANIONGAP 3 (L) 03/18/2022    CBG (last 3)  Recent Labs    03/17/22 1623 03/17/22 2100 03/18/22 0815  GLUCAP 172* 136* 117*      Coagulation Profile: No results for input(s): "INR", "PROTIME" in the last 168 hours.   Radiology Studies: I have personally reviewed the imaging studies  No results found.     Estill Cotta M.D. [facility] 03/18/2022, 11:36 AM  Available via Epic secure chat 7am-7pm After 7 pm, please refer to night coverage provider listed on amion.

## 2022-03-20 DIAGNOSIS — E118 Type 2 diabetes mellitus with unspecified complications: Secondary | ICD-10-CM

## 2022-03-20 DIAGNOSIS — N179 Acute kidney failure, unspecified: Secondary | ICD-10-CM | POA: Diagnosis not present

## 2022-03-20 DIAGNOSIS — E1165 Type 2 diabetes mellitus with hyperglycemia: Secondary | ICD-10-CM

## 2022-03-20 DIAGNOSIS — M6282 Rhabdomyolysis: Secondary | ICD-10-CM | POA: Diagnosis not present

## 2022-03-20 LAB — COMPREHENSIVE METABOLIC PANEL
ALT: 150 U/L — ABNORMAL HIGH (ref 0–44)
AST: 160 U/L — ABNORMAL HIGH (ref 15–41)
Albumin: 3 g/dL — ABNORMAL LOW (ref 3.5–5.0)
Alkaline Phosphatase: 40 U/L (ref 38–126)
Anion gap: 6 (ref 5–15)
BUN: 18 mg/dL (ref 8–23)
CO2: 32 mmol/L (ref 22–32)
Calcium: 8.7 mg/dL — ABNORMAL LOW (ref 8.9–10.3)
Chloride: 104 mmol/L (ref 98–111)
Creatinine, Ser: 1.16 mg/dL (ref 0.61–1.24)
GFR, Estimated: >60 mL/min (ref >60)
Glucose, Bld: 115 mg/dL — ABNORMAL HIGH (ref 70–99)
Potassium: 4.1 mmol/L (ref 3.5–5.1)
Sodium: 142 mmol/L (ref 135–145)
Total Bilirubin: 0.8 mg/dL (ref 0.3–1.2)
Total Protein: 5.6 g/dL — ABNORMAL LOW (ref 6.5–8.1)

## 2022-03-20 LAB — GLUCOSE, CAPILLARY
Glucose-Capillary: 101 mg/dL — ABNORMAL HIGH (ref 70–99)
Glucose-Capillary: 127 mg/dL — ABNORMAL HIGH (ref 70–99)

## 2022-03-20 LAB — CK: Total CK: 7253 U/L — ABNORMAL HIGH (ref 49–397)

## 2022-03-20 MED ORDER — "PEN NEEDLES 3/16"" 31G X 5 MM MISC": 0.0000 [IU] | Freq: Three times a day (TID) | 3 refills | Status: DC

## 2022-03-20 MED ORDER — LIVING WELL WITH DIABETES BOOK
Freq: Once | Status: AC
  Filled 2022-03-20: qty 1

## 2022-03-20 MED ORDER — FREESTYLE LIBRE 2 SENSOR MISC: 0.0000 [IU] | Freq: Three times a day (TID) | 5 refills | Status: DC

## 2022-03-20 MED ORDER — INSULIN STARTER KIT- PEN NEEDLES (ENGLISH)
1.0000 | Freq: Once | Status: AC
  Administered 2022-03-20: 1
  Filled 2022-03-20: qty 1

## 2022-03-20 MED ORDER — NOVOLOG FLEXPEN 100 UNIT/ML ~~LOC~~ SOPN: 0.0000 [IU] | PEN_INJECTOR | Freq: Three times a day (TID) | SUBCUTANEOUS | 4 refills | Status: AC

## 2022-03-20 MED ORDER — OXYCODONE HCL 5 MG PO TABS: 5.0000 mg | ORAL_TABLET | Freq: Three times a day (TID) | ORAL | 0 refills | Status: DC | PRN

## 2022-03-20 MED ORDER — LANTUS SOLOSTAR 100 UNIT/ML ~~LOC~~ SOPN: 5.0000 [IU] | PEN_INJECTOR | Freq: Every day | SUBCUTANEOUS | 3 refills | Status: DC

## 2022-03-20 NOTE — Inpatient Diabetes Management (Addendum)
Inpatient Diabetes Program Recommendations  AACE/ADA: New Consensus Statement on Inpatient Glycemic Control (2015)  Target Ranges:  Prepandial:   less than 140 mg/dL      Peak postprandial:   less than 180 mg/dL (1-2 hours)      Critically ill patients:  140 - 180 mg/dL   Lab Results  Component Value Date   GLUCAP 127 (H) 03/20/2022   HGBA1C 6.4 (H) 03/14/2022   Inpatient Diabetes Program Recommendations:    Please consider for D/C plan: -Lantus 5 units qd if fasting CBG >180 (order #[id number]) -Novolog 0-9 tid correction scale with meals (Order # [id number]) -Pen Needles #[id number] Elenor Legato [phone])  Educated patient on insulin pen use at home. Reviewed contents of insulin flexpen starter kit. Reviewed all steps if insulin pen including attachment of needle, 2-unit air shot, dialing up dose, giving injection, removing needle, disposal of sharps, storage of unused insulin, disposal of insulin etc. Patient able to provide successful return demonstration. Also reviewed troubleshooting with insulin pen. MD to give patient Rxs for insulin pens and insulin pen needles.  Ordered living Well With Diabetes for review per patient request and insulin pen starter kit.  MD ordered application of Freestyle CGM at discharge for patient. Education done regarding application and changing CGM sensor (alternate every 14 days on back of arms), 1 hour warm-up, use of glucometer when alert displays, how to scan CGM for glucose reading and information for PCP. Patient has also been given educational packet regarding use CGM sensor including the 1-800 toll free number for any questions, problems or needs related to the [facility] sensors or reader.    Sensor applied by patient to (L) Arm at 1200p.  Explained that glucose readings will not be available until 1 hour after application. Reviewed use of CGM including how to scan, changing Sensor, Vitamin C warning, arrows with glucose readings, and Freestyle app. Gave patient  Freestyle reader as well due to the app not being compatible with her phone.  Patient very appreciative.   Thank you, Nani Gasser. Emaline Karnes, RN, MSN, CDE  Diabetes Coordinator Inpatient Glycemic Control Team Team Pager [phone] (8am-5pm) 03/20/2022 11:42 AM

## 2022-03-20 NOTE — Progress Notes (Signed)
Pt is discharging to home. VSS. Given PRN oxycodone '5mg'$  for his chronic back/buttocks pain. Discharge paperwork discussed with patient. All questions/concerns addressed. No further needs at this time.    03/20/22 1301  Vitals  Temp 98.5 F (36.9 C)  BP 123/69  MAP (mmHg) 87  BP Location Left Arm  BP Method Automatic  Patient Position (if appropriate) Lying  Pulse Rate 65  Pulse Rate Source Monitor  Resp 16  MEWS COLOR  MEWS Score Color Green  Oxygen Therapy  SpO2 98 %  O2 Device Room Air  Pain Assessment  Pain Scale 0-10  Pain Score 6  Pain Type Chronic pain  Pain Location Back  Pain Orientation Right;Left  Pain Descriptors / Indicators Aching  Pain Frequency Constant  Pain Onset Progressive  Patients Stated Pain Goal 2  Pain Intervention(s) Medication (See eMAR)

## 2022-03-20 NOTE — Care Management Important Message (Signed)
Important Message  Patient Details  Name: Jordan Santana MRN: [id number] Date of Birth: 1950/10/20   Medicare Important Message Given:  Yes     Dannette Barbara 03/20/2022, 12:52 PM

## 2022-03-20 NOTE — Discharge Summary (Signed)
Physician Discharge Summary   Patient: Jordan Santana MRN: [id number] DOB: 09-Mar-1951  Admit date:     03/14/2022  Discharge date: 03/20/22  Discharge Physician: Estill Cotta, MD    PCP: [name], MD   Recommendations at discharge:   Metformin pioglitazone, farxiga, Rybelsus,  tramadol discontinued due to potential risk for rhabdomyolysis  Started on insulin Follow CK, LFTs, renal function in next 1 to 2 weeks   Discharge Diagnoses:    AKI (acute kidney injury) ([place])   Essential hypertension   DM II (diabetes mellitus, type II), controlled ([place])   Non-traumatic rhabdomyolysis   Transaminitis   Acute kidney injury (AKI) with acute tubular necrosis (ATN) [facility])    Hospital Course: Patient is a 71 year old male with history of recurrent rhabdomyolysis, hypertension, hyperlipidemia, diabetes mellitus presented with myalgias and muscle aches affecting his thighs, pelvic girdle and upper shoulders.  Patient reported that he has been having aches and has had 4 prior episodes of rhabdomyolysis. In ED, patient was found to have mild AKI, creatinine 1.3, no baseline available CK>50,000 on admission, LDH 2597 AST 660, ALT 208, alk phos 47, total bilirubin 0.7  Assessment and Plan:  AKI (acute kidney injury) ([place]) -Likely due to acute rhabdomyolysis -Creatinine 1.3 on admission, -Creatinine improved, 1.1     Non-traumatic rhabdomyolysis, recurrent -Likely precipitated due to COVID 2 weeks ago, however could be due to his oral hypoglycemics and tramadol -Per patient, this is his fifth episode of rhabdomyolysis, has had evaluation in the past with no clear etiology found -Neuro consulted, unlikely to be inflammatory in nature, episodes spread over the course of 40 years would argue against an autoimmune cause, recommended to follow-up with neuromuscular specialist outpatient.   -Patient was also followed by nephrology - Likely will need to follow-up with rheumatology outpatient,  ambulatory referral sent -CK>50,000 on admission, trending down, improved to 7253 at discharge.   Transaminitis -Likely due to rhabdomyolysis, LFTs improving -Abdominal ultrasound normal     Essential hypertension -BP stable       DM II (diabetes mellitus, type II), controlled ([facility]) -Hold semaglutide, metformin, canagliflozin, Actos, Rybelsus can cause rhabdomyolysis, will discontinue on discharge HbA1c 6.4 -Placed on Lantus 5 units at bedtime if fasting CBG running high, sliding scale insulin sensitive correction factor -Ambulatory referral to endocrinology sent   Obesity Estimated body mass index is 43.91 kg/m as calculated from the following:   Height as of 02/04/21: _0  (1.676 m).   Weight as of this encounter: 123.4 kg.    Pain control - [employer] Controlled Substance Reporting System database was reviewed. and patient was instructed, not to drive, operate heavy machinery, perform activities at heights, swimming or participation in water activities or provide baby-sitting services while on Pain, Sleep and Anxiety Medications; until their outpatient Physician has advised to do so again. Also recommended to not to take more than prescribed Pain, Sleep and Anxiety Medications.  Consultants:nephrology, neurology  Procedures performed: none  Disposition: Home Diet recommendation:  Discharge Diet Orders (From admission, onward)     Start     Ordered   03/20/22 0000  Diet Carb Modified        03/20/22 1154            DISCHARGE MEDICATION: Allergies as of 03/20/2022       Reactions   Bee Venom Hives   HONEY BEE   Biaxin [clarithromycin] Rash        Medication List     STOP taking  these medications    Farxiga 10 MG Tabs tablet Generic drug: dapagliflozin propanediol   irbesartan-hydrochlorothiazide 150-12.5 MG tablet Commonly known as: AVALIDE   metFORMIN 1000 MG tablet Commonly known as: GLUCOPHAGE   pioglitazone 45 MG tablet Commonly known as:  ACTOS   Rybelsus 14 MG Tabs   traMADol 50 MG tablet Commonly known as: ULTRAM       TAKE these medications    aspirin EC 81 MG tablet Take 81 mg by mouth daily.   FreeStyle Libre 2 Sensor Misc Inject 0-9 Units into the skin 4 (four) times daily -  before meals and at bedtime.   ibuprofen 800 MG tablet Commonly known as: ADVIL Take 800 mg by mouth every 8 (eight) hours as needed.   Lantus SoloStar 100 UNIT/ML Solostar Pen Generic drug: insulin glargine Inject 5 Units into the skin at bedtime. If AM fasting blood sugar is above >180   NovoLOG FlexPen 100 UNIT/ML FlexPen Generic drug: insulin aspart Inject 0-9 Units into the skin 3 (three) times daily with meals. Sliding scale CBG 70 - 120: 0 units CBG 121 - 150: 1 unit,  CBG 151 - 200: 2 units,  CBG 201 - 250: 3 units,  CBG 251 - 300: 5 units,  CBG 301 - 350: 7 units,  CBG 351 - 400: 9 units   CBG > 400: 9 units and notify your MD   oxyCODONE 5 MG immediate release tablet Commonly known as: Oxy IR/ROXICODONE Take 1 tablet (5 mg total) by mouth every 8 (eight) hours as needed for moderate pain or severe pain.   Pen Needles 3/16" 31G X 5 MM Misc Inject 0-9 Units into the skin 4 (four) times daily -  before meals and at bedtime.        Discharge Exam: Filed Weights   03/17/22 0318 03/18/22 0453 03/19/22 0520  Weight: 119.2 kg 123.4 kg 120.4 kg   S: No acute complaints, looking forward to discharge home today  Vitals:   03/19/22 1606 03/19/22 2000 03/20/22 0354 03/20/22 0825  BP: 137/74 (!) 102/57 134/68 129/75  Pulse: 64 64 (!) 58 61  Resp: _0 Temp: 98.4 F (36.9 C) 99 F (37.2 C) 98.7 F (37.1 C) 99.3 F (37.4 C)  TempSrc: Oral Oral Oral   SpO2: 100% 98% 97% 100%  Weight:        Physical Exam General: Alert and oriented x 3, NAD Cardiovascular: S1 S2 clear, RRR.  Respiratory: CTAB, no wheezing, rales or rhonchi Gastrointestinal: Soft, nontender, nondistended, NBS Ext: no pedal edema  bilaterally Neuro: no new deficits Psych: Normal affect   Condition at discharge: good  The results of significant diagnostics from this hospitalization (including imaging, microbiology, ancillary and laboratory) are listed below for reference.   Imaging Studies: US Abdomen Limited RUQ (LIVER/GB)  Result Date: 03/15/2022 CLINICAL DATA:  Abnormal LFTs EXAM: ULTRASOUND ABDOMEN LIMITED RIGHT UPPER QUADRANT COMPARISON:  None Available. FINDINGS: Gallbladder: No gallstones or wall thickening visualized. No sonographic Murphy sign noted by sonographer. Common bile duct: Diameter: 0.4 cm Liver: No focal lesion identified. Within normal limits in parenchymal echogenicity. Portal vein is patent on color Doppler imaging with normal direction of blood flow towards the liver. Other: None. IMPRESSION: No sonographic finding to explain abnormal LFTs on the provided images. Electronically Signed   By: Marin Roberts M.D.   On: 03/15/2022 09:53    Microbiology: No results found for this or any previous visit.  Labs: CBC: Recent Labs  Lab 03/14/22 1809 03/15/22 0037  WBC 8.9 7.2  NEUTROABS 5.1  --   HGB 14.4 13.0  HCT 45.8 41.5  MCV 93.1 93.0  PLT 288 350   Basic Metabolic Panel: Recent Labs  Lab 03/15/22 0037 03/16/22 0629 03/17/22 0617 03/18/22 0605 03/19/22 0517 03/20/22 0553  NA 137 140 142 141 142 142  K 4.4 4.4 4.2 4.4 4.2 4.1  CL 105 104 106 106 109 104  CO2 _0 32 29 32  GLUCOSE 133* 116* 115* 124* 116* 115*  BUN 29* _1 CREATININE 1.28* 1.10 1.12 1.14 1.12 1.16  CALCIUM 8.5* 8.7* 8.6* 8.5* 9.0 8.7*  MG 2.5*  --   --   --   --   --    Liver Function Tests: Recent Labs  Lab 03/16/22 0629 03/17/22 0617 03/18/22 0605 03/19/22 0517 03/20/22 0553  AST 500* 431* 297* 223* 160*  ALT 168* 166* 156* 157* 150*  ALKPHOS 37* 37* 37* 39 40  BILITOT 0.7 0.6 0.4 0.5 0.8  PROT 5.8* 5.4* 5.4* 5.5* 5.6*  ALBUMIN 3.3* 2.9* 2.9* 3.0* 3.0*   CBG: Recent Labs  Lab  03/19/22 1153 03/19/22 1635 03/19/22 2129 03/20/22 0810 03/20/22 1133  GLUCAP 112* 92 106* 101* 127*    Discharge time spent: greater than 30 minutes.  Signed: Estill Cotta, MD [facility] 03/20/2022

## 2022-03-20 NOTE — Progress Notes (Signed)
[facility]  PROGRESS NOTE   Subjective:   Patient seen resting in bed Alert and oriented, no family at bedside Patient is continues to complain of mild soreness however improved Appetite appropriate Room air  Objective:  Vital signs: Blood pressure 123/69, pulse 65, temperature 98.5 F (36.9 C), temperature source Oral, resp. rate 16, weight 120.4 kg, SpO2 98 %.  Intake/Output Summary (Last 24 hours) at 03/20/2022 1359 Last data filed at 03/20/2022 1307 Gross per 24 hour  Intake 2247.03 ml  Output 5800 ml  Net -3552.97 ml    Filed Weights   03/17/22 0318 03/18/22 0453 03/19/22 0520  Weight: 119.2 kg 123.4 kg 120.4 kg     Physical Exam: General:  No acute distress  Head:  Normocephalic, atraumatic. Moist oral mucosal membranes  Eyes:  Anicteric  Lungs:   Clear to auscultation, normal effort  Heart:  S1S2 no rubs  Abdomen:   Soft, nontender, bowel sounds present  Extremities: No peripheral edema.  Neurologic:  Awake, alert, following commands  Skin:  No lesions  Access: None    Basic Metabolic Panel: Recent Labs  Lab 03/15/22 0037 03/16/22 0629 03/17/22 0617 03/18/22 0605 03/19/22 0517 03/20/22 0553  NA 137 140 142 141 142 142  K 4.4 4.4 4.2 4.4 4.2 4.1  CL 105 104 106 106 109 104  CO2 '27 30 28 '$ 32 29 32  GLUCOSE 133* 116* 115* 124* 116* 115*  BUN 29* '21 17 16 18 18  '$ CREATININE 1.28* 1.10 1.12 1.14 1.12 1.16  CALCIUM 8.5* 8.7* 8.6* 8.5* 9.0 8.7*  MG 2.5*  --   --   --   --   --      CBC: Recent Labs  Lab 03/14/22 1809 03/15/22 0037  WBC 8.9 7.2  NEUTROABS 5.1  --   HGB 14.4 13.0  HCT 45.8 41.5  MCV 93.1 93.0  PLT 288 245      Urinalysis: No results for input(s): "COLORURINE", "LABSPEC", "PHURINE", "GLUCOSEU", "HGBUR", "BILIRUBINUR", "KETONESUR", "PROTEINUR", "UROBILINOGEN", "NITRITE", "LEUKOCYTESUR" in the last 72 hours.  Invalid input(s): "APPERANCEUR"    Imaging: No results found.   Medications:    sodium  bicarbonate 150 mEq in sterile water 1,150 mL infusion 200 mL/hr at 03/20/22 1011    aspirin EC  81 mg Oral Daily   heparin  5,000 Units Subcutaneous Q8H   insulin aspart  0-15 Units Subcutaneous TID WC   polyethylene glycol  17 g Oral BID   senna-docusate  1 tablet Oral BID   sodium chloride flush  3 mL Intravenous Q12H   sodium phosphate  1 enema Rectal Once    Assessment/ Plan:     Principal Problem:   AKI (acute kidney injury) ([place]) Active Problems:   Essential hypertension   DM II (diabetes mellitus, type II), controlled ([place])   Non-traumatic rhabdomyolysis   Transaminitis   Acute kidney injury (AKI) with acute tubular necrosis (ATN) ([place])  71 year old male with history of hypertension, diabetes, hyperlipidemia, history of myalgias now admitted with history of recurrent rhabdomyolysis, elevated liver enzymes and acute kidney injury.  #1: Acute kidney injury likely secondary to rhabdomyolysis: CPK levels continue to improve.  Adequate urine output noted.  #3: Elevated liver enzymes: Improving  #4: Diabetes melitis type II: Diet controlled.  Glucose well controlled.  #5: Hypertension: Antihypertensives on hold.  Blood pressure stable for this patient  Due to stable renal function and declining CK levels, we will sign off at this time.  Feel free to  contact us with any further questions or concerns.   LOS: [facility] 11/6/20231:59 PM

## 2022-06-16 ENCOUNTER — Telehealth: Payer: Self-pay

## 2022-06-16 NOTE — Telephone Encounter (Signed)
Patient called requesting cough syrup with codeine states he's tried OTC cough meds but nothing is working for his dry cough. Please advise - ao

## 2022-06-20 ENCOUNTER — Other Ambulatory Visit: Payer: Self-pay

## 2022-06-21 NOTE — Telephone Encounter (Signed)
Called patient and left voice mail for return call to schedule-ES

## 2022-08-18 ENCOUNTER — Ambulatory Visit: Payer: BLUE CROSS/BLUE SHIELD | Admitting: Internal Medicine

## 2022-08-25 ENCOUNTER — Ambulatory Visit: Payer: BLUE CROSS/BLUE SHIELD | Admitting: Nurse Practitioner

## 2022-09-15 ENCOUNTER — Ambulatory Visit: Payer: Medicare Other | Admitting: Internal Medicine

## 2022-09-22 ENCOUNTER — Other Ambulatory Visit: Payer: Self-pay | Admitting: Internal Medicine

## 2022-09-22 ENCOUNTER — Other Ambulatory Visit: Payer: Medicare Other

## 2022-09-23 LAB — CK: Total CK: 161 U/L (ref 41–331)

## 2022-09-23 LAB — HGB A1C W/O EAG: Hgb A1c MFr Bld: 7.4 % — ABNORMAL HIGH (ref 4.8–5.6)

## 2022-09-23 LAB — LIPID PANEL W/O CHOL/HDL RATIO
Cholesterol, Total: 218 mg/dL — ABNORMAL HIGH (ref 100–199)
HDL: 59 mg/dL (ref >39)
LDL Chol Calc (NIH): 146 mg/dL — ABNORMAL HIGH (ref 0–99)
Triglycerides: 72 mg/dL (ref 0–149)
VLDL Cholesterol Cal: 13 mg/dL (ref 5–40)

## 2022-09-28 ENCOUNTER — Other Ambulatory Visit: Payer: Self-pay | Admitting: Internal Medicine

## 2022-09-29 ENCOUNTER — Encounter: Payer: Self-pay | Admitting: Internal Medicine

## 2022-09-29 ENCOUNTER — Ambulatory Visit (INDEPENDENT_AMBULATORY_CARE_PROVIDER_SITE_OTHER): Payer: Medicare Other | Admitting: Internal Medicine

## 2022-09-29 VITALS — BP 160/100 | HR 65 | Ht 66.0 in | Wt 276.2 lb

## 2022-09-29 DIAGNOSIS — Z6841 Body Mass Index (BMI) 40.0 and over, adult: Secondary | ICD-10-CM

## 2022-09-29 DIAGNOSIS — N529 Male erectile dysfunction, unspecified: Secondary | ICD-10-CM

## 2022-09-29 DIAGNOSIS — G8929 Other chronic pain: Secondary | ICD-10-CM

## 2022-09-29 DIAGNOSIS — I10 Essential (primary) hypertension: Secondary | ICD-10-CM

## 2022-09-29 DIAGNOSIS — E782 Mixed hyperlipidemia: Secondary | ICD-10-CM | POA: Diagnosis not present

## 2022-09-29 DIAGNOSIS — Z794 Long term (current) use of insulin: Secondary | ICD-10-CM | POA: Diagnosis not present

## 2022-09-29 DIAGNOSIS — E119 Type 2 diabetes mellitus without complications: Secondary | ICD-10-CM | POA: Diagnosis not present

## 2022-09-29 DIAGNOSIS — M545 Low back pain, unspecified: Secondary | ICD-10-CM

## 2022-09-29 LAB — POCT CBG (FASTING - GLUCOSE)-MANUAL ENTRY: Glucose Fasting, POC: 94 mg/dL (ref 70–99)

## 2022-09-29 MED ORDER — TADALAFIL 20 MG PO TABS
20.0000 mg | ORAL_TABLET | Freq: Every day | ORAL | Status: DC | PRN
Start: 2022-09-29 — End: 2022-12-15

## 2022-09-29 MED ORDER — IRBESARTAN-HYDROCHLOROTHIAZIDE 150-12.5 MG PO TABS
1.0000 | ORAL_TABLET | Freq: Every day | ORAL | 0 refills | Status: DC
Start: 2022-09-29 — End: 2023-02-02

## 2022-09-29 MED ORDER — DAPAGLIFLOZIN PROPANEDIOL 5 MG PO TABS
5.0000 mg | ORAL_TABLET | Freq: Every day | ORAL | 1 refills | Status: DC
Start: 2022-09-29 — End: 2022-11-28

## 2022-09-29 MED ORDER — LANTUS SOLOSTAR 100 UNIT/ML ~~LOC~~ SOPN
10.0000 [IU] | PEN_INJECTOR | Freq: Every day | SUBCUTANEOUS | 0 refills | Status: DC
Start: 2022-09-29 — End: 2023-02-02

## 2022-09-29 MED ORDER — TRAMADOL HCL 50 MG PO TABS
50.0000 mg | ORAL_TABLET | Freq: Four times a day (QID) | ORAL | 0 refills | Status: DC | PRN
Start: 2022-09-29 — End: 2022-11-08

## 2022-09-29 MED ORDER — SEMAGLUTIDE 14 MG PO TABS: 14.0000 mg | ORAL_TABLET | Freq: Every day | ORAL | 0 refills | Status: DC

## 2022-09-29 NOTE — Progress Notes (Signed)
Established Patient Office Visit  Subjective:  Patient ID: Jordan Santana, male    DOB: 30-Mar-1951  Age: 72 y.o. MRN: [id number]  Chief Complaint  Patient presents with   Follow-up    3 month follow up, discuss lab results.    No new complaints, here for lab review and medication refills. BP elevated and admits to dietary indiscretion with 17 lb weight gain.and suboptimal compliance with meds.    No other concerns at this time.   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     REMOVED    Social History   Socioeconomic History   Marital status: Married    Spouse name: Not on file   Number of children: Not on file   Years of education: Not on file   Highest education level: Not on file  Occupational History   Not on file  Tobacco Use   Smoking status: Never   Smokeless tobacco: Never  Vaping Use   Vaping Use: Never used  Substance and Sexual Activity   Alcohol use: Yes    Comment: rarekt   Drug use: Never   Sexual activity: Not on file  Other Topics Concern   Not on file  Social History Narrative   Not on file   Social Determinants of Health   Financial Resource Strain: Not on file  Food Insecurity: No Food Insecurity (03/14/2022)   Hunger Vital Sign    Worried About Running Out of Food in the Last Year: Never true    Ran Out of Food in the Last Year: Never true  Transportation Needs: No Transportation  Needs (03/14/2022)   PRAPARE - Administrator, Civil Service (Medical): No    Lack of Transportation (Non-Medical): No  Physical Activity: Not on file  Stress: Not on file  Social Connections: Not on file  Intimate Partner Violence: Not At Risk (03/14/2022)   Humiliation, Afraid, Rape, and Kick questionnaire    Fear of Current or Ex-Partner: No    Emotionally Abused: No    Physically Abused: No    Sexually Abused: No    No family history on file.  Allergies  Allergen Reactions   Bee Venom Hives    HONEY BEE   Biaxin [Clarithromycin] Rash    Review of Systems  Constitutional: Negative.   HENT: Negative.    Eyes: Negative.   Respiratory: Negative.    Cardiovascular: Negative.   Gastrointestinal: Negative.   Genitourinary: Negative.   Skin: Negative.   Neurological: Negative.   Endo/Heme/Allergies: Negative.        Objective:   BP (!) 160/100   Pulse 65   Ht 5\' 6"  (1.676 m)   Wt 276 lb 3.2 oz (125.3 kg)   SpO2 96%   BMI 44.58 kg/m   Vitals:   09/29/22 1534  BP: (!) 160/100  Pulse: 65  Height: 5\' 6"  (1.676 m)  Weight:  276 lb 3.2 oz (125.3 kg)  SpO2: 96%  BMI (Calculated): 44.6    Physical Exam Vitals reviewed.  Constitutional:      Appearance: Normal appearance. He is obese.  HENT:     Head: Normocephalic.     Left Ear: There is no impacted cerumen.     Nose: Nose normal.     Mouth/Throat:     Mouth: Mucous membranes are moist.     Pharynx: No posterior oropharyngeal erythema.  Eyes:     Extraocular Movements: Extraocular movements intact.     Pupils: Pupils are equal, round, and reactive to light.  Cardiovascular:     Rate and Rhythm: Regular rhythm.     Chest Wall: PMI is not displaced.     Pulses: Normal pulses.     Heart sounds: Normal heart sounds. No murmur heard. Pulmonary:     Effort: Pulmonary effort is normal.     Breath sounds: Normal air entry. No rhonchi or rales.  Abdominal:     General: Abdomen is flat. Bowel  sounds are normal. There is no distension.     Palpations: Abdomen is soft. There is no hepatomegaly, splenomegaly or mass.     Tenderness: There is no abdominal tenderness.  Musculoskeletal:        General: Normal range of motion.     Cervical back: Normal range of motion and neck supple.     Right lower leg: No edema.     Left lower leg: No edema.  Skin:    General: Skin is warm and dry.  Neurological:     General: No focal deficit present.     Mental Status: He is alert and oriented to person, place, and time.     Cranial Nerves: No cranial nerve deficit.     Motor: No weakness.  Psychiatric:        Mood and Affect: Mood normal.        Behavior: Behavior normal.      Results for orders placed or performed in visit on 09/29/22  POCT CBG (Fasting - Glucose)  Result Value Ref Range   Glucose Fasting, POC 94 70 - 99 mg/dL    Recent Results (from the past 2160 hour(Jasani Lengel))  Lipid Panel w/o Chol/HDL Ratio     Status: Abnormal   Collection Time: 09/22/22 10:39 AM  Result Value Ref Range   Cholesterol, Total 218 (H) 100 - 199 mg/dL   Triglycerides 72 0 - 149 mg/dL   HDL 59 >16 mg/dL   VLDL Cholesterol Cal 13 5 - 40 mg/dL   LDL Chol Calc (NIH) 109 (H) 0 - 99 mg/dL  Hgb U0A w/o eAG     Status: Abnormal   Collection Time: 09/22/22 10:39 AM  Result Value Ref Range   Hgb A1c MFr Bld 7.4 (H) 4.8 - 5.6 %    Comment:          Prediabetes: 5.7 - 6.4          Diabetes: >6.4          Glycemic control for adults with diabetes: <7.0   CK     Status: None   Collection Time: 09/22/22 10:39 AM  Result Value Ref Range   Total CK 161 41 - 331 U/L  POCT CBG (Fasting - Glucose)     Status: None   Collection Time: 09/29/22  3:50 PM  Result Value Ref Range   Glucose Fasting, POC 94 70 - 99 mg/dL  Assessment & Plan:   Problem List Items Addressed This Visit       Endocrine   DM II (diabetes mellitus, type II), controlled (HCC) - Primary   Relevant Medications   Semaglutide 14 MG  TABS   pioglitazone (ACTOS) 45 MG tablet   Other Relevant Orders   POCT CBG (Fasting - Glucose) (Completed)    No follow-ups on file.   Total time spent: 30 minutes  Luna Fuse, MD  09/29/2022   This document may have been prepared by [facility] Voice Recognition software and as such may include unintentional dictation errors.

## 2022-10-31 ENCOUNTER — Telehealth: Payer: Self-pay

## 2022-10-31 NOTE — Telephone Encounter (Signed)
Pharmacy is requesting freestyle libre 3 sensor to be sent to [facility]

## 2022-11-01 ENCOUNTER — Other Ambulatory Visit: Payer: Self-pay | Admitting: Internal Medicine

## 2022-11-01 DIAGNOSIS — E119 Type 2 diabetes mellitus without complications: Secondary | ICD-10-CM

## 2022-11-01 MED ORDER — FREESTYLE LIBRE 3 SENSOR MISC
1.0000 | 11 refills | Status: DC
Start: 2022-11-01 — End: 2022-12-27

## 2022-11-07 ENCOUNTER — Other Ambulatory Visit: Payer: Self-pay | Admitting: Internal Medicine

## 2022-11-07 DIAGNOSIS — M545 Low back pain, unspecified: Secondary | ICD-10-CM

## 2022-11-10 ENCOUNTER — Ambulatory Visit: Payer: Medicare Other | Admitting: Internal Medicine

## 2022-12-14 ENCOUNTER — Other Ambulatory Visit: Payer: Medicare Other

## 2022-12-14 DIAGNOSIS — E119 Type 2 diabetes mellitus without complications: Secondary | ICD-10-CM

## 2022-12-15 ENCOUNTER — Ambulatory Visit: Payer: Medicare Other | Admitting: Internal Medicine

## 2022-12-15 VITALS — BP 130/84 | HR 49 | Ht 66.0 in | Wt 267.2 lb

## 2022-12-15 DIAGNOSIS — E119 Type 2 diabetes mellitus without complications: Secondary | ICD-10-CM

## 2022-12-15 DIAGNOSIS — Z794 Long term (current) use of insulin: Secondary | ICD-10-CM

## 2022-12-15 DIAGNOSIS — N529 Male erectile dysfunction, unspecified: Secondary | ICD-10-CM | POA: Diagnosis not present

## 2022-12-15 DIAGNOSIS — E782 Mixed hyperlipidemia: Secondary | ICD-10-CM | POA: Diagnosis not present

## 2022-12-15 DIAGNOSIS — I10 Essential (primary) hypertension: Secondary | ICD-10-CM | POA: Diagnosis not present

## 2022-12-15 LAB — POCT CBG (FASTING - GLUCOSE)-MANUAL ENTRY: Glucose Fasting, POC: 105 mg/dL — AB (ref 70–99)

## 2022-12-15 MED ORDER — TADALAFIL 20 MG PO TABS
20.0000 mg | ORAL_TABLET | Freq: Every day | ORAL | Status: DC | PRN
Start: 2022-12-15 — End: 2023-02-02

## 2022-12-15 MED ORDER — SHINGRIX 50 MCG/0.5ML IM SUSR
0.5000 mL | Freq: Once | INTRAMUSCULAR | 0 refills | Status: AC
Start: 2022-12-15 — End: 2022-12-15

## 2022-12-15 MED ORDER — SEMAGLUTIDE 14 MG PO TABS
14.0000 mg | ORAL_TABLET | Freq: Every day | ORAL | 0 refills | Status: DC
Start: 2022-12-15 — End: 2022-12-15

## 2022-12-15 MED ORDER — MOUNJARO 7.5 MG/0.5ML ~~LOC~~ SOAJ
7.5000 mg | SUBCUTANEOUS | 1 refills | Status: DC
Start: 2022-12-15 — End: 2023-02-02

## 2022-12-15 NOTE — Progress Notes (Signed)
Established Patient Office Visit  Subjective:  Patient ID: Jordan Santana, male    DOB: 1951-01-04  Age: 72 y.o. MRN: [id number]  Chief Complaint  Patient presents with   Follow-up    6 week f/u w/lab results    No new complaints, here for lab review and medication refills. Labs reviewed and notable for well controlled fructosamine at target. Denies any hypoglycemic episodes and home bg readings have been at target.  Expresses desire to lose more weight.      No other concerns at this time.   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     REMOVED    Social History   Socioeconomic History   Marital status: Married    Spouse name: Not on file   Number of children: Not on file   Years of education: Not on file   Highest education level: Not on file  Occupational History   Not on file  Tobacco Use   Smoking status: Never   Smokeless tobacco: Never  Vaping Use   Vaping status: Never Used  Substance and Sexual Activity   Alcohol use: Yes    Comment: rarekt   Drug use: Never   Sexual activity: Not on file  Other Topics Concern   Not on file  Social History Narrative   Not on file   Social Determinants of Health   Financial Resource Strain: Not on file  Food Insecurity: No Food Insecurity (03/14/2022)   Hunger Vital Sign    Worried About Running Out of Food in the Last Year: Never true    Ran Out of Food in  the Last Year: Never true  Transportation Needs: No Transportation Needs (03/14/2022)   PRAPARE - Administrator, Civil Service (Medical): No    Lack of Transportation (Non-Medical): No  Physical Activity: Not on file  Stress: Not on file  Social Connections: Not on file  Intimate Partner Violence: Not At Risk (03/14/2022)   Humiliation, Afraid, Rape, and Kick questionnaire    Fear of Current or Ex-Partner: No    Emotionally Abused: No    Physically Abused: No    Sexually Abused: No    No family history on file.  Allergies  Allergen Reactions   Augmentin [Amoxicillin-Pot Clavulanate]    Bee Venom Hives    HONEY BEE   Biaxin [Clarithromycin] Rash    Review of Systems  Constitutional:  Positive for weight loss (9 lbs).  HENT: Negative.    Eyes: Negative.   Respiratory: Negative.    Cardiovascular: Negative.   Gastrointestinal: Negative.   Genitourinary: Negative.   Skin: Negative.   Neurological: Negative.   Endo/Heme/Allergies: Negative.        Objective:   BP 130/84   Pulse (!) 49   Ht 5\' 6"  (1.676 m)   Wt 267 lb 3.2 oz (121.2 kg)   SpO2 99%   BMI  43.13 kg/m   Vitals:   12/15/22 0940  BP: 130/84  Pulse: (!) 49  Height: 5\' 6"  (1.676 m)  Weight: 267 lb 3.2 oz (121.2 kg)  SpO2: 99%  BMI (Calculated): 43.15    Physical Exam Vitals reviewed.  Constitutional:      Appearance: Normal appearance. He is obese.  HENT:     Head: Normocephalic.     Left Ear: There is no impacted cerumen.     Nose: Nose normal.     Mouth/Throat:     Mouth: Mucous membranes are moist.     Pharynx: No posterior oropharyngeal erythema.  Eyes:     Extraocular Movements: Extraocular movements intact.     Pupils: Pupils are equal, round, and reactive to light.  Cardiovascular:     Rate and Rhythm: Regular rhythm.     Chest Wall: PMI is not displaced.     Pulses: Normal pulses.     Heart sounds: Normal heart sounds. No murmur heard. Pulmonary:     Effort:  Pulmonary effort is normal.     Breath sounds: Normal air entry. No rhonchi or rales.  Abdominal:     General: Abdomen is flat. Bowel sounds are normal. There is no distension.     Palpations: Abdomen is soft. There is no hepatomegaly, splenomegaly or mass.     Tenderness: There is no abdominal tenderness.  Musculoskeletal:        General: Normal range of motion.     Cervical back: Normal range of motion and neck supple.     Right lower leg: No edema.     Left lower leg: No edema.  Skin:    General: Skin is warm and dry.  Neurological:     General: No focal deficit present.     Mental Status: He is alert and oriented to person, place, and time.     Cranial Nerves: No cranial nerve deficit.     Motor: No weakness.  Psychiatric:        Mood and Affect: Mood normal.        Behavior: Behavior normal.      Results for orders placed or performed in visit on 12/15/22  POCT CBG (Fasting - Glucose)  Result Value Ref Range   Glucose Fasting, POC 105 (A) 70 - 99 mg/dL    Recent Results (from the past 2160 hour(Daley Gosse))  Lipid Panel w/o Chol/HDL Ratio     Status: Abnormal   Collection Time: 09/22/22 10:39 AM  Result Value Ref Range   Cholesterol, Total 218 (H) 100 - 199 mg/dL   Triglycerides 72 0 - 149 mg/dL   HDL 59 >51 mg/dL   VLDL Cholesterol Cal 13 5 - 40 mg/dL   LDL Chol Calc (NIH) 025 (H) 0 - 99 mg/dL  Hgb E5I w/o eAG     Status: Abnormal   Collection Time: 09/22/22 10:39 AM  Result Value Ref Range   Hgb A1c MFr Bld 7.4 (H) 4.8 - 5.6 %    Comment:          Prediabetes: 5.7 - 6.4          Diabetes: >6.4          Glycemic control for adults with diabetes: <7.0   CK     Status: None   Collection Time: 09/22/22 10:39 AM  Result Value Ref Range   Total CK 161 41 - 331 U/L  POCT CBG (Fasting - Glucose)     Status: None  Collection Time: 09/29/22  3:50 PM  Result Value Ref Range   Glucose Fasting, POC 94 70 - 99 mg/dL  Fructosamine     Status: None   Collection Time: 12/14/22   9:08 AM  Result Value Ref Range   Fructosamine 261 0 - 285 umol/L    Comment: Published reference interval for apparently healthy subjects between age 24 and 40 is 19 - 285 umol/L and in a poorly controlled diabetic population is 228 - 563 umol/L with a mean of 396 umol/L.   POCT CBG (Fasting - Glucose)     Status: Abnormal   Collection Time: 12/15/22  9:50 AM  Result Value Ref Range   Glucose Fasting, POC 105 (A) 70 - 99 mg/dL      Assessment & Plan:  As per problem list. Change GLP-1 to Mounjaro to facilitate weight loss.  Problem List Items Addressed This Visit       Endocrine   DM II (diabetes mellitus, type II), controlled (HCC) - Primary   Relevant Medications   dapagliflozin propanediol (FARXIGA) 5 MG TABS tablet   tirzepatide (MOUNJARO) 7.5 MG/0.5ML Pen   Other Relevant Orders   POCT CBG (Fasting - Glucose) (Completed)   Hemoglobin A1c   Other Visit Diagnoses     ED (erectile dysfunction) of organic origin       Relevant Medications   tadalafil (CIALIS) 20 MG tablet   Mixed hyperlipidemia       Relevant Medications   tadalafil (CIALIS) 20 MG tablet   Other Relevant Orders   Lipid panel   Comprehensive metabolic panel   CK       Return in about 6 weeks (around 01/26/2023) for fu with labs prior.   Total time spent: 20 minutes  Luna Fuse, MD  12/15/2022   This document may have been prepared by [facility] Voice Recognition software and as such may include unintentional dictation errors.

## 2022-12-19 ENCOUNTER — Other Ambulatory Visit: Payer: Self-pay

## 2022-12-19 MED ORDER — IBUPROFEN 800 MG PO TABS: 800.0000 mg | ORAL_TABLET | Freq: Three times a day (TID) | ORAL | 0 refills | Status: DC | PRN

## 2022-12-26 ENCOUNTER — Telehealth: Payer: Self-pay | Admitting: Internal Medicine

## 2022-12-26 NOTE — Telephone Encounter (Signed)
Patient called in needing Rx for Freestyle Libre 3 to be sent to [facility]. Their phone number is [phone].

## 2022-12-27 ENCOUNTER — Other Ambulatory Visit: Payer: Self-pay | Admitting: Internal Medicine

## 2022-12-27 DIAGNOSIS — E119 Type 2 diabetes mellitus without complications: Secondary | ICD-10-CM

## 2022-12-27 MED ORDER — FREESTYLE LIBRE 3 SENSOR MISC
1.0000 | 11 refills | Status: DC
Start: 2022-12-27 — End: 2022-12-28

## 2022-12-27 MED ORDER — FREESTYLE LIBRE 3 SENSOR MISC
1.0000 | 11 refills | Status: DC
Start: 2022-12-27 — End: 2022-12-27

## 2022-12-28 ENCOUNTER — Other Ambulatory Visit: Payer: Self-pay

## 2022-12-28 DIAGNOSIS — E119 Type 2 diabetes mellitus without complications: Secondary | ICD-10-CM

## 2022-12-28 MED ORDER — FREESTYLE LIBRE 3 SENSOR MISC: 1.0000 | 11 refills | Status: DC

## 2022-12-29 ENCOUNTER — Other Ambulatory Visit: Payer: Self-pay

## 2022-12-29 DIAGNOSIS — Z794 Long term (current) use of insulin: Secondary | ICD-10-CM

## 2023-01-01 ENCOUNTER — Other Ambulatory Visit: Payer: Self-pay

## 2023-01-01 DIAGNOSIS — E119 Type 2 diabetes mellitus without complications: Secondary | ICD-10-CM

## 2023-01-01 MED ORDER — FREESTYLE LIBRE 3 SENSOR MISC: 1.0000 | 11 refills | Status: DC

## 2023-01-01 MED ORDER — FREESTYLE LIBRE 3 SENSOR MISC
1.0000 | 11 refills | Status: AC
Start: 2023-01-01 — End: 2024-01-01

## 2023-01-02 ENCOUNTER — Other Ambulatory Visit: Payer: Self-pay

## 2023-01-02 DIAGNOSIS — Z794 Long term (current) use of insulin: Secondary | ICD-10-CM

## 2023-01-02 MED ORDER — FREESTYLE LIBRE 3 SENSOR MISC: 1.0000 | 11 refills | Status: DC

## 2023-01-05 ENCOUNTER — Other Ambulatory Visit: Payer: Self-pay

## 2023-01-05 DIAGNOSIS — E119 Type 2 diabetes mellitus without complications: Secondary | ICD-10-CM

## 2023-01-05 MED ORDER — FREESTYLE LIBRE 3 SENSOR MISC
1.0000 | 11 refills | Status: DC
Start: 2023-01-05 — End: 2023-03-13

## 2023-01-25 ENCOUNTER — Other Ambulatory Visit: Payer: Medicare Other

## 2023-01-25 DIAGNOSIS — E119 Type 2 diabetes mellitus without complications: Secondary | ICD-10-CM

## 2023-01-25 DIAGNOSIS — E782 Mixed hyperlipidemia: Secondary | ICD-10-CM

## 2023-01-26 LAB — HEMOGLOBIN A1C
Est. average glucose Bld gHb Est-mCnc: 163 mg/dL
Hgb A1c MFr Bld: 7.3 % — ABNORMAL HIGH (ref 4.8–5.6)

## 2023-01-26 LAB — COMPREHENSIVE METABOLIC PANEL
ALT: 10 IU/L (ref 0–44)
AST: 13 IU/L (ref 0–40)
Albumin: 4.1 g/dL (ref 3.8–4.8)
Alkaline Phosphatase: 52 IU/L (ref 44–121)
BUN/Creatinine Ratio: 11 (ref 10–24)
BUN: 13 mg/dL (ref 8–27)
Bilirubin Total: 0.4 mg/dL (ref 0.0–1.2)
CO2: 24 mmol/L (ref 20–29)
Calcium: 9 mg/dL (ref 8.6–10.2)
Chloride: 102 mmol/L (ref 96–106)
Creatinine, Ser: 1.22 mg/dL (ref 0.76–1.27)
Globulin, Total: 2 g/dL (ref 1.5–4.5)
Glucose: 95 mg/dL (ref 70–99)
Potassium: 4.1 mmol/L (ref 3.5–5.2)
Sodium: 140 mmol/L (ref 134–144)
Total Protein: 6.1 g/dL (ref 6.0–8.5)
eGFR: 63 mL/min/{1.73_m2} (ref >59)

## 2023-01-26 LAB — LIPID PANEL
Chol/HDL Ratio: 4 ratio (ref 0.0–5.0)
Cholesterol, Total: 194 mg/dL (ref 100–199)
HDL: 49 mg/dL (ref >39)
LDL Chol Calc (NIH): 132 mg/dL — ABNORMAL HIGH (ref 0–99)
Triglycerides: 72 mg/dL (ref 0–149)
VLDL Cholesterol Cal: 13 mg/dL (ref 5–40)

## 2023-01-26 LAB — CK: Total CK: 97 U/L (ref 41–331)

## 2023-02-02 ENCOUNTER — Ambulatory Visit (INDEPENDENT_AMBULATORY_CARE_PROVIDER_SITE_OTHER): Payer: Medicare Other | Admitting: Internal Medicine

## 2023-02-02 ENCOUNTER — Encounter: Payer: Self-pay | Admitting: Internal Medicine

## 2023-02-02 VITALS — BP 138/72 | HR 57 | Ht 66.0 in | Wt 257.8 lb

## 2023-02-02 DIAGNOSIS — E118 Type 2 diabetes mellitus with unspecified complications: Secondary | ICD-10-CM | POA: Diagnosis not present

## 2023-02-02 DIAGNOSIS — N529 Male erectile dysfunction, unspecified: Secondary | ICD-10-CM | POA: Diagnosis not present

## 2023-02-02 DIAGNOSIS — I10 Essential (primary) hypertension: Secondary | ICD-10-CM | POA: Diagnosis not present

## 2023-02-02 DIAGNOSIS — U071 COVID-19: Secondary | ICD-10-CM

## 2023-02-02 DIAGNOSIS — Z794 Long term (current) use of insulin: Secondary | ICD-10-CM

## 2023-02-02 DIAGNOSIS — J40 Bronchitis, not specified as acute or chronic: Secondary | ICD-10-CM

## 2023-02-02 DIAGNOSIS — E119 Type 2 diabetes mellitus without complications: Secondary | ICD-10-CM

## 2023-02-02 LAB — POCT CBG (FASTING - GLUCOSE)-MANUAL ENTRY: Glucose Fasting, POC: 93 mg/dL (ref 70–99)

## 2023-02-02 MED ORDER — LANTUS SOLOSTAR 100 UNIT/ML ~~LOC~~ SOPN
10.0000 [IU] | PEN_INJECTOR | Freq: Every day | SUBCUTANEOUS | 0 refills | Status: DC
Start: 2023-02-02 — End: 2023-05-03

## 2023-02-02 MED ORDER — IRBESARTAN-HYDROCHLOROTHIAZIDE 150-12.5 MG PO TABS
1.0000 | ORAL_TABLET | Freq: Every day | ORAL | 0 refills | Status: DC
Start: 2023-02-02 — End: 2023-06-01

## 2023-02-02 MED ORDER — PREDNISONE 20 MG PO TABS
40.0000 mg | ORAL_TABLET | Freq: Every day | ORAL | 0 refills | Status: AC
Start: 2023-02-02 — End: 2023-02-07

## 2023-02-02 MED ORDER — TADALAFIL 20 MG PO TABS
20.0000 mg | ORAL_TABLET | Freq: Every day | ORAL | Status: DC | PRN
Start: 2023-02-02 — End: 2023-03-16

## 2023-02-02 MED ORDER — MOUNJARO 10 MG/0.5ML ~~LOC~~ SOAJ
10.0000 mg | SUBCUTANEOUS | 2 refills | Status: DC
Start: 2023-02-02 — End: 2023-03-16

## 2023-02-02 MED ORDER — DAPAGLIFLOZIN PROPANEDIOL 10 MG PO TABS
10.0000 mg | ORAL_TABLET | Freq: Every day | ORAL | 0 refills | Status: DC
Start: 2023-02-02 — End: 2023-06-01

## 2023-02-02 NOTE — Progress Notes (Signed)
Established Patient Office Visit  Subjective:  Patient ID: Jordan Santana, male    DOB: Jan 02, 1951  Age: 72 y.o. MRN: [id number]  Chief Complaint  Patient presents with   Follow-up    C/o persistent unproductive cough after recovering from COVID. Also here for lab review and medication refills. Labs reviewed and notable for uncontrolled diabetes, A1c not at target, lipids at target with unremarkable cmp. Denies any hypoglycemic episodes and home bg readings have been at target.   No other concerns at this time.   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     REMOVED    Social History   Socioeconomic History   Marital status: Married    Spouse name: Not on file   Number of children: Not on file   Years of education: Not on file   Highest education level: Not on file  Occupational History   Not on file  Tobacco Use   Smoking status: Never   Smokeless tobacco: Never  Vaping Use   Vaping status: Never Used  Substance and Sexual Activity   Alcohol use: Yes    Comment: rarekt   Drug use: Never   Sexual activity: Not on file  Other Topics Concern   Not on file  Social History Narrative   Not on file   Social Determinants of Health   Financial Resource Strain: Not on file  Food Insecurity: No Food Insecurity (03/14/2022)   Hunger Vital Sign    Worried About Running Out of Food in the Last Year: Never true    Ran  Out of Food in the Last Year: Never true  Transportation Needs: No Transportation Needs (03/14/2022)   PRAPARE - Administrator, Civil Service (Medical): No    Lack of Transportation (Non-Medical): No  Physical Activity: Not on file  Stress: Not on file  Social Connections: Not on file  Intimate Partner Violence: Not At Risk (03/14/2022)   Humiliation, Afraid, Rape, and Kick questionnaire    Fear of Current or Ex-Partner: No    Emotionally Abused: No    Physically Abused: No    Sexually Abused: No    No family history on file.  Allergies  Allergen Reactions   Augmentin [Amoxicillin-Pot Clavulanate]    Bee Venom Hives    HONEY BEE   Biaxin [Clarithromycin] Rash    Review of Systems  Constitutional:  Positive for weight loss (9 lbs).  HENT: Negative.    Eyes: Negative.   Respiratory: Negative.    Cardiovascular: Negative.   Gastrointestinal: Negative.   Genitourinary: Negative.   Skin: Negative.   Neurological: Negative.   Endo/Heme/Allergies: Negative.        Objective:   BP 138/72   Pulse (!) 57   Ht 5\' 6"  (1.676 m)   Wt 257 lb 12.8 oz (116.9 kg)   SpO2 99%   BMI 41.61 kg/m   Vitals:  02/02/23 1156  BP: 138/72  Pulse: (!) 57  Height: 5\' 6"  (1.676 m)  Weight: 257 lb 12.8 oz (116.9 kg)  SpO2: 99%  BMI (Calculated): 41.63    Physical Exam Vitals reviewed.  Constitutional:      Appearance: Normal appearance. He is obese.  HENT:     Head: Normocephalic.     Left Ear: There is no impacted cerumen.     Nose: Nose normal.     Mouth/Throat:     Mouth: Mucous membranes are moist.     Pharynx: No posterior oropharyngeal erythema.  Eyes:     Extraocular Movements: Extraocular movements intact.     Pupils: Pupils are equal, round, and reactive to light.  Cardiovascular:     Rate and Rhythm: Regular rhythm.     Chest Wall: PMI is not displaced.     Pulses: Normal pulses.     Heart sounds: Normal heart sounds. No murmur heard. Pulmonary:      Effort: Pulmonary effort is normal.     Breath sounds: Normal air entry. No rhonchi or rales.  Abdominal:     General: Abdomen is flat. Bowel sounds are normal. There is no distension.     Palpations: Abdomen is soft. There is no hepatomegaly, splenomegaly or mass.     Tenderness: There is no abdominal tenderness.  Musculoskeletal:        General: Normal range of motion.     Cervical back: Normal range of motion and neck supple.     Right lower leg: No edema.     Left lower leg: No edema.  Skin:    General: Skin is warm and dry.  Neurological:     General: No focal deficit present.     Mental Status: He is alert and oriented to person, place, and time.     Cranial Nerves: No cranial nerve deficit.     Motor: No weakness.  Psychiatric:        Mood and Affect: Mood normal.        Behavior: Behavior normal.      Results for orders placed or performed in visit on 02/02/23  POCT CBG (Fasting - Glucose)  Result Value Ref Range   Glucose Fasting, POC 93 70 - 99 mg/dL    Recent Results (from the past 2160 hour(Randon Somera))  Fructosamine     Status: None   Collection Time: 12/14/22  9:08 AM  Result Value Ref Range   Fructosamine 261 0 - 285 umol/L    Comment: Published reference interval for apparently healthy subjects between age 19 and 59 is 10 - 285 umol/L and in a poorly controlled diabetic population is 228 - 563 umol/L with a mean of 396 umol/L.   POCT CBG (Fasting - Glucose)     Status: Abnormal   Collection Time: 12/15/22  9:50 AM  Result Value Ref Range   Glucose Fasting, POC 105 (A) 70 - 99 mg/dL  CK     Status: None   Collection Time: 01/25/23  9:07 AM  Result Value Ref Range   Total CK 97 41 - 331 U/L  Comprehensive metabolic panel     Status: None   Collection Time: 01/25/23  9:07 AM  Result Value Ref Range   Glucose 95 70 - 99 mg/dL   BUN 13 8 - 27 mg/dL   Creatinine, Ser 6.96 0.76 - 1.27 mg/dL   eGFR 63 >29 BM/WUX/3.24   BUN/Creatinine Ratio 11 10 - 24  Sodium 140 134 - 144 mmol/L   Potassium 4.1 3.5 - 5.2 mmol/L   Chloride 102 96 - 106 mmol/L   CO2 24 20 - 29 mmol/L   Calcium 9.0 8.6 - 10.2 mg/dL   Total Protein 6.1 6.0 - 8.5 g/dL   Albumin 4.1 3.8 - 4.8 g/dL   Globulin, Total 2.0 1.5 - 4.5 g/dL   Bilirubin Total 0.4 0.0 - 1.2 mg/dL   Alkaline Phosphatase 52 44 - 121 IU/L   AST 13 0 - 40 IU/L   ALT 10 0 - 44 IU/L  Lipid panel     Status: Abnormal   Collection Time: 01/25/23  9:07 AM  Result Value Ref Range   Cholesterol, Total 194 100 - 199 mg/dL   Triglycerides 72 0 - 149 mg/dL   HDL 49 >62 mg/dL   VLDL Cholesterol Cal 13 5 - 40 mg/dL   LDL Chol Calc (NIH) 130 (H) 0 - 99 mg/dL   Chol/HDL Ratio 4.0 0.0 - 5.0 ratio    Comment:                                   T. Chol/HDL Ratio                                             Men  Women                               1/2 Avg.Risk  3.4    3.3                                   Avg.Risk  5.0    4.4                                2X Avg.Risk  9.6    7.1                                3X Avg.Risk 23.4   11.0   Hemoglobin A1c     Status: Abnormal   Collection Time: 01/25/23  9:07 AM  Result Value Ref Range   Hgb A1c MFr Bld 7.3 (H) 4.8 - 5.6 %    Comment:          Prediabetes: 5.7 - 6.4          Diabetes: >6.4          Glycemic control for adults with diabetes: <7.0    Est. average glucose Bld gHb Est-mCnc 163 mg/dL  POCT CBG (Fasting - Glucose)     Status: None   Collection Time: 02/02/23 12:02 PM  Result Value Ref Range   Glucose Fasting, POC 93 70 - 99 mg/dL      Assessment & Plan:  As per problem list.  Problem List Items Addressed This Visit       Cardiovascular and Mediastinum   Essential hypertension   Relevant Medications   irbesartan-hydrochlorothiazide (AVALIDE) 150-12.5 MG tablet   tadalafil (CIALIS) 20 MG tablet     Endocrine   DM  II (diabetes mellitus, type II), controlled (HCC) - Primary   Relevant Medications   insulin glargine (LANTUS SOLOSTAR) 100  UNIT/ML Solostar Pen   irbesartan-hydrochlorothiazide (AVALIDE) 150-12.5 MG tablet   tirzepatide (MOUNJARO) 10 MG/0.5ML Pen   dapagliflozin propanediol (FARXIGA) 10 MG TABS tablet   Other Relevant Orders   POCT CBG (Fasting - Glucose) (Completed)   Other Visit Diagnoses     ED (erectile dysfunction) of organic origin       Relevant Medications   tadalafil (CIALIS) 20 MG tablet   Bronchitis due to 2019 novel coronavirus       Relevant Medications   predniSONE (DELTASONE) 20 MG tablet       Return in about 6 weeks (around 03/16/2023) for fu with labs prior.   Total time spent: 20 minutes  Luna Fuse, MD  02/02/2023   This document may have been prepared by [facility] Voice Recognition software and as such may include unintentional dictation errors.

## 2023-02-05 ENCOUNTER — Other Ambulatory Visit: Payer: Self-pay | Admitting: Internal Medicine

## 2023-02-05 DIAGNOSIS — E119 Type 2 diabetes mellitus without complications: Secondary | ICD-10-CM

## 2023-02-05 MED ORDER — LANTUS SOLOSTAR 100 UNIT/ML ~~LOC~~ SOPN: 10.0000 [IU] | PEN_INJECTOR | Freq: Every day | SUBCUTANEOUS | 0 refills | Status: DC

## 2023-03-08 ENCOUNTER — Other Ambulatory Visit: Payer: Medicare Other

## 2023-03-08 DIAGNOSIS — E782 Mixed hyperlipidemia: Secondary | ICD-10-CM

## 2023-03-08 DIAGNOSIS — E118 Type 2 diabetes mellitus with unspecified complications: Secondary | ICD-10-CM

## 2023-03-08 DIAGNOSIS — I10 Essential (primary) hypertension: Secondary | ICD-10-CM

## 2023-03-09 LAB — CMP14+EGFR
ALT: 14 [IU]/L (ref 0–44)
AST: 12 [IU]/L (ref 0–40)
Albumin: 4 g/dL (ref 3.8–4.8)
Alkaline Phosphatase: 56 [IU]/L (ref 44–121)
BUN/Creatinine Ratio: 14 (ref 10–24)
BUN: 20 mg/dL (ref 8–27)
Bilirubin Total: 0.6 mg/dL (ref 0.0–1.2)
CO2: 23 mmol/L (ref 20–29)
Calcium: 9.3 mg/dL (ref 8.6–10.2)
Chloride: 102 mmol/L (ref 96–106)
Creatinine, Ser: 1.43 mg/dL — ABNORMAL HIGH (ref 0.76–1.27)
Globulin, Total: 2.1 g/dL (ref 1.5–4.5)
Glucose: 110 mg/dL — ABNORMAL HIGH (ref 70–99)
Potassium: 4.1 mmol/L (ref 3.5–5.2)
Sodium: 141 mmol/L (ref 134–144)
Total Protein: 6.1 g/dL (ref 6.0–8.5)
eGFR: 52 mL/min/{1.73_m2} — ABNORMAL LOW (ref >59)

## 2023-03-09 LAB — HEMOGLOBIN A1C
Est. average glucose Bld gHb Est-mCnc: 157 mg/dL
Hgb A1c MFr Bld: 7.1 % — ABNORMAL HIGH (ref 4.8–5.6)

## 2023-03-09 LAB — LIPID PANEL
Chol/HDL Ratio: 3.9 ratio (ref 0.0–5.0)
Cholesterol, Total: 197 mg/dL (ref 100–199)
HDL: 50 mg/dL (ref >39)
LDL Chol Calc (NIH): 133 mg/dL — ABNORMAL HIGH (ref 0–99)
Triglycerides: 77 mg/dL (ref 0–149)
VLDL Cholesterol Cal: 14 mg/dL (ref 5–40)

## 2023-03-09 LAB — TSH: TSH: 0.632 u[IU]/mL (ref 0.450–4.500)

## 2023-03-12 ENCOUNTER — Other Ambulatory Visit: Payer: Self-pay | Admitting: Internal Medicine

## 2023-03-12 DIAGNOSIS — E119 Type 2 diabetes mellitus without complications: Secondary | ICD-10-CM

## 2023-03-16 ENCOUNTER — Ambulatory Visit (INDEPENDENT_AMBULATORY_CARE_PROVIDER_SITE_OTHER): Payer: Medicare Other | Admitting: Internal Medicine

## 2023-03-16 ENCOUNTER — Encounter: Payer: Self-pay | Admitting: Internal Medicine

## 2023-03-16 VITALS — BP 122/82 | HR 74 | Ht 66.0 in | Wt 257.8 lb

## 2023-03-16 DIAGNOSIS — E118 Type 2 diabetes mellitus with unspecified complications: Secondary | ICD-10-CM | POA: Diagnosis not present

## 2023-03-16 DIAGNOSIS — Z23 Encounter for immunization: Secondary | ICD-10-CM

## 2023-03-16 DIAGNOSIS — N400 Enlarged prostate without lower urinary tract symptoms: Secondary | ICD-10-CM

## 2023-03-16 DIAGNOSIS — N1831 Chronic kidney disease, stage 3a: Secondary | ICD-10-CM | POA: Diagnosis not present

## 2023-03-16 DIAGNOSIS — Z0130 Encounter for examination of blood pressure without abnormal findings: Secondary | ICD-10-CM

## 2023-03-16 DIAGNOSIS — N529 Male erectile dysfunction, unspecified: Secondary | ICD-10-CM

## 2023-03-16 DIAGNOSIS — Z794 Long term (current) use of insulin: Secondary | ICD-10-CM

## 2023-03-16 LAB — POC CREATINE & ALBUMIN,URINE
Albumin/Creatinine Ratio, Urine, POC: <30
Creatinine, POC: 50 mg/dL
Microalbumin Ur, POC: 10 mg/L

## 2023-03-16 LAB — POCT CBG (FASTING - GLUCOSE)-MANUAL ENTRY: Glucose Fasting, POC: 94 mg/dL (ref 70–99)

## 2023-03-16 MED ORDER — TADALAFIL 20 MG PO TABS: 20.0000 mg | ORAL_TABLET | Freq: Every day | ORAL | Status: DC | PRN

## 2023-03-16 MED ORDER — FREESTYLE LIBRE 3 PLUS SENSOR MISC: 2 refills | Status: DC

## 2023-03-16 MED ORDER — MOUNJARO 12.5 MG/0.5ML ~~LOC~~ SOAJ: 12.5000 mg | SUBCUTANEOUS | 2 refills | Status: DC

## 2023-03-16 MED ORDER — LANTUS SOLOSTAR 100 UNIT/ML ~~LOC~~ SOPN: 10.0000 [IU] | PEN_INJECTOR | Freq: Every day | SUBCUTANEOUS | 2 refills | Status: DC

## 2023-03-16 NOTE — Progress Notes (Signed)
Established Patient Office Visit  Subjective:  Patient ID: Jordan Santana, male    DOB: 01-20-1951  Age: 72 y.o. MRN: [id number]  Chief Complaint  Patient presents with  . Follow-up    6 week follow up, discuss lab results.    C/o general malaise and unproductive cough x 2 days with a headache. No known contact with influenza or COVID. Also here for lab review and medication refills. Labs reviewed and notable for improved but uncontrolled diabetes, A1c not at target, lipids at target with unremarkable cmp. Denies any hypoglycemic episodes.    No other concerns at this time.   Past Medical History:  Diagnosis Date  . Diabetes mellitus without complication (HCC)   . Dysrhythmia   . Hypercholesteremia   . Hypertension     Past Surgical History:  Procedure Laterality Date  . ABSESS TONGUE    . COLONOSCOPY WITH PROPOFOL N/A 11/06/2017   Procedure: COLONOSCOPY WITH PROPOFOL;  Surgeon: Toledo, Boykin Nearing, MD;  Location: [facility] END[facility]COPY;  Service: Gastroenterology;  Laterality: N/A;  . COLONOSCOPY WITH PROPOFOL N/A 02/04/2021   Procedure: COLONOSCOPY WITH PROPOFOL;  Surgeon: Regis Bill, MD;  Location: [facility] END[facility]COPY;  Service: Endoscopy;  Laterality: N/A;  DM  . TUMOR ON FINGER     REMOVED    Social History   Socioeconomic History  . Marital status: Married    Spouse name: Not on file  . Number of children: Not on file  . Years of education: Not on file  . Highest education level: Not on file  Occupational History  . Not on file  Tobacco Use  . Smoking status: Never  . Smokeless tobacco: Never  Vaping Use  . Vaping status: Never Used  Substance and Sexual Activity  . Alcohol use: Yes    Comment: rarekt  . Drug use: Never  . Sexual activity: Not on file  Other Topics Concern  . Not on file  Social History Narrative  . Not on file   Social Determinants of Health   Financial Resource Strain: Not on file  Food Insecurity: No Food Insecurity (03/14/2022)   Hunger  Vital Sign   . Worried About Programme researcher, broadcasting/film/video in the Last Year: Never true   . Ran Out of Food in the Last Year: Never true  Transportation Needs: No Transportation Needs (03/14/2022)   PRAPARE - Transportation   . Lack of Transportation (Medical): No   . Lack of Transportation (Non-Medical): No  Physical Activity: Not on file  Stress: Not on file  Social Connections: Not on file  Intimate Partner Violence: Not At Risk (03/14/2022)   Humiliation, Afraid, Rape, and Kick questionnaire   . Fear of Current or Ex-Partner: No   . Emotionally Abused: No   . Physically Abused: No   . Sexually Abused: No    No family history on file.  Allergies  Allergen Reactions  . Augmentin [Amoxicillin-Pot Clavulanate]   . Bee Venom Hives    HONEY BEE  . Biaxin [Clarithromycin] Rash    Review of Systems  Constitutional:  Negative for weight loss.  HENT: Negative.    Eyes: Negative.   Respiratory: Negative.    Cardiovascular: Negative.   Gastrointestinal: Negative.   Genitourinary: Negative.   Skin: Negative.   Neurological: Negative.   Endo/Heme/Allergies: Negative.        Objective:   BP 122/82   Pulse 74   Ht 5\' 6"  (1.676 m)   Wt 257 lb 12.8 oz (116.9  kg)   SpO2 96%   BMI 41.61 kg/m   Vitals:   03/16/23 1112  BP: 122/82  Pulse: 74  Height: 5\' 6"  (1.676 m)  Weight: 257 lb 12.8 oz (116.9 kg)  SpO2: 96%  BMI (Calculated): 41.63    Physical Exam Vitals reviewed.  Constitutional:      Appearance: Normal appearance. He is obese.  HENT:     Head: Normocephalic.     Left Ear: There is no impacted cerumen.     Nose: Nose normal.     Mouth/Throat:     Mouth: Mucous membranes are moist.     Pharynx: No posterior oropharyngeal erythema.  Eyes:     Extraocular Movements: Extraocular movements intact.     Pupils: Pupils are equal, round, and reactive to light.  Cardiovascular:     Rate and Rhythm: Regular rhythm.     Chest Wall: PMI is not displaced.     Pulses:  Normal pulses.     Heart sounds: Normal heart sounds. No murmur heard. Pulmonary:     Effort: Pulmonary effort is normal.     Breath sounds: Normal air entry. No rhonchi or rales.  Abdominal:     General: Abdomen is flat. Bowel sounds are normal. There is no distension.     Palpations: Abdomen is soft. There is no hepatomegaly, splenomegaly or mass.     Tenderness: There is no abdominal tenderness.  Musculoskeletal:        General: Normal range of motion.     Cervical back: Normal range of motion and neck supple.     Right lower leg: No edema.     Left lower leg: No edema.  Skin:    General: Skin is warm and dry.  Neurological:     General: No focal deficit present.     Mental Status: He is alert and oriented to person, place, and time.     Cranial Nerves: No cranial nerve deficit.     Motor: No weakness.  Psychiatric:        Mood and Affect: Mood normal.        Behavior: Behavior normal.     Results for orders placed or performed in visit on 03/16/23  POCT CBG (Fasting - Glucose)  Result Value Ref Range   Glucose Fasting, POC 94 70 - 99 mg/dL    Recent Results (from the past 2160 hour(Eleen Litz))  CK     Status: None   Collection Time: 01/25/23  9:07 AM  Result Value Ref Range   Total CK 97 41 - 331 U/L  Comprehensive metabolic panel     Status: None   Collection Time: 01/25/23  9:07 AM  Result Value Ref Range   Glucose 95 70 - 99 mg/dL   BUN 13 8 - 27 mg/dL   Creatinine, Ser 4.09 0.76 - 1.27 mg/dL   eGFR 63 >81 XB/JYN/8.29   BUN/Creatinine Ratio 11 10 - 24   Sodium 140 134 - 144 mmol/L   Potassium 4.1 3.5 - 5.2 mmol/L   Chloride 102 96 - 106 mmol/L   CO2 24 20 - 29 mmol/L   Calcium 9.0 8.6 - 10.2 mg/dL   Total Protein 6.1 6.0 - 8.5 g/dL   Albumin 4.1 3.8 - 4.8 g/dL   Globulin, Total 2.0 1.5 - 4.5 g/dL   Bilirubin Total 0.4 0.0 - 1.2 mg/dL   Alkaline Phosphatase 52 44 - 121 IU/L   AST 13 0 - 40 IU/L   ALT  10 0 - 44 IU/L  Lipid panel     Status: Abnormal    Collection Time: 01/25/23  9:07 AM  Result Value Ref Range   Cholesterol, Total 194 100 - 199 mg/dL   Triglycerides 72 0 - 149 mg/dL   HDL 49 >84 mg/dL   VLDL Cholesterol Cal 13 5 - 40 mg/dL   LDL Chol Calc (NIH) 696 (H) 0 - 99 mg/dL   Chol/HDL Ratio 4.0 0.0 - 5.0 ratio    Comment:                                   T. Chol/HDL Ratio                                             Men  Women                               1/2 Avg.Risk  3.4    3.3                                   Avg.Risk  5.0    4.4                                2X Avg.Risk  9.6    7.1                                3X Avg.Risk 23.4   11.0   Hemoglobin A1c     Status: Abnormal   Collection Time: 01/25/23  9:07 AM  Result Value Ref Range   Hgb A1c MFr Bld 7.3 (H) 4.8 - 5.6 %    Comment:          Prediabetes: 5.7 - 6.4          Diabetes: >6.4          Glycemic control for adults with diabetes: <7.0    Est. average glucose Bld gHb Est-mCnc 163 mg/dL  POCT CBG (Fasting - Glucose)     Status: None   Collection Time: 02/02/23 12:02 PM  Result Value Ref Range   Glucose Fasting, POC 93 70 - 99 mg/dL  Hemoglobin E9B     Status: Abnormal   Collection Time: 03/08/23  8:33 AM  Result Value Ref Range   Hgb A1c MFr Bld 7.1 (H) 4.8 - 5.6 %    Comment:          Prediabetes: 5.7 - 6.4          Diabetes: >6.4          Glycemic control for adults with diabetes: <7.0    Est. average glucose Bld gHb Est-mCnc 157 mg/dL  TSH     Status: None   Collection Time: 03/08/23  8:33 AM  Result Value Ref Range   TSH 0.632 0.450 - 4.500 uIU/mL  CMP14+EGFR     Status: Abnormal   Collection Time: 03/08/23  8:33 AM  Result Value Ref Range   Glucose 110 (H) 70 - 99 mg/dL   BUN  20 8 - 27 mg/dL   Creatinine, Ser 9.56 (H) 0.76 - 1.27 mg/dL   eGFR 52 (L) >21 HY/QMV/7.84   BUN/Creatinine Ratio 14 10 - 24   Sodium 141 134 - 144 mmol/L   Potassium 4.1 3.5 - 5.2 mmol/L   Chloride 102 96 - 106 mmol/L   CO2 23 20 - 29 mmol/L   Calcium 9.3 8.6 -  10.2 mg/dL   Total Protein 6.1 6.0 - 8.5 g/dL   Albumin 4.0 3.8 - 4.8 g/dL   Globulin, Total 2.1 1.5 - 4.5 g/dL   Bilirubin Total 0.6 0.0 - 1.2 mg/dL   Alkaline Phosphatase 56 44 - 121 IU/L   AST 12 0 - 40 IU/L   ALT 14 0 - 44 IU/L  Lipid panel     Status: Abnormal   Collection Time: 03/08/23  8:33 AM  Result Value Ref Range   Cholesterol, Total 197 100 - 199 mg/dL   Triglycerides 77 0 - 149 mg/dL   HDL 50 >69 mg/dL   VLDL Cholesterol Cal 14 5 - 40 mg/dL   LDL Chol Calc (NIH) 629 (H) 0 - 99 mg/dL   Chol/HDL Ratio 3.9 0.0 - 5.0 ratio    Comment:                                   T. Chol/HDL Ratio                                             Men  Women                               1/2 Avg.Risk  3.4    3.3                                   Avg.Risk  5.0    4.4                                2X Avg.Risk  9.6    7.1                                3X Avg.Risk 23.4   11.0   POCT CBG (Fasting - Glucose)     Status: None   Collection Time: 03/16/23 11:18 AM  Result Value Ref Range   Glucose Fasting, POC 94 70 - 99 mg/dL      Assessment & Plan:  As per problem list. Increase GLP-1. Problem List Items Addressed This Visit       Endocrine   DM II (diabetes mellitus, type II), controlled (HCC) - Primary   Relevant Medications   insulin glargine (LANTUS SOLOSTAR) 100 UNIT/ML Solostar Pen   tirzepatide (MOUNJARO) 12.5 MG/0.5ML Pen   Other Relevant Orders   POCT CBG (Fasting - Glucose) (Completed)   Fructosamine   Other Visit Diagnoses     ED (erectile dysfunction) of organic origin       Relevant Medications   tadalafil (CIALIS) 20 MG tablet   Benign prostatic hyperplasia without lower urinary  tract symptoms       Relevant Orders   PSA   CKD stage 3a, GFR 45-59 ml/min (HCC)       Relevant Orders   Comprehensive metabolic panel       Return in about 6 weeks (around 04/27/2023) for awv with labs prior.   Total time spent: 20 minutes  Luna Fuse,  MD  03/16/2023   This document may have been prepared by [facility] Voice Recognition software and as such may include unintentional dictation errors.

## 2023-03-19 ENCOUNTER — Telehealth: Payer: Self-pay

## 2023-03-19 NOTE — Telephone Encounter (Signed)
Patient called aso

## 2023-03-19 NOTE — Telephone Encounter (Signed)
Patient called stating that he's just got the flu shot and wants to know since he has covid can he get the covid shot

## 2023-03-20 ENCOUNTER — Other Ambulatory Visit: Payer: Self-pay

## 2023-03-20 ENCOUNTER — Telehealth: Payer: Self-pay

## 2023-03-20 MED ORDER — PAXLOVID (300/100) 20 X 150 MG & 10 X 100MG PO TBPK: 3.0000 | ORAL_TABLET | Freq: Two times a day (BID) | ORAL | 0 refills | Status: AC

## 2023-03-20 NOTE — Telephone Encounter (Signed)
Pharmacy called to let us know that they can not get the Paxlovid, we need to call pt and find out what other pharmacy he would like to have it sent to

## 2023-03-20 NOTE — Telephone Encounter (Signed)
Its been  sent

## 2023-04-19 ENCOUNTER — Telehealth: Payer: Self-pay | Admitting: Internal Medicine

## 2023-04-19 NOTE — Telephone Encounter (Signed)
Patient left VM requesting Libre 3 samples. Called back and left him VM that we do not have and samples currently.

## 2023-04-27 ENCOUNTER — Ambulatory Visit: Payer: Medicare Other | Admitting: Internal Medicine

## 2023-05-01 ENCOUNTER — Telehealth: Payer: Self-pay | Admitting: Internal Medicine

## 2023-05-01 NOTE — Telephone Encounter (Signed)
Left patient VM informing him that I have Freestyle Libre samples here for him.

## 2023-05-11 ENCOUNTER — Telehealth: Payer: Self-pay | Admitting: Internal Medicine

## 2023-05-11 NOTE — Telephone Encounter (Signed)
Left patient another VM to see if he wants these Freestyle Libre samples or not.

## 2023-05-25 ENCOUNTER — Other Ambulatory Visit: Payer: Medicare Other

## 2023-05-25 DIAGNOSIS — N1831 Chronic kidney disease, stage 3a: Secondary | ICD-10-CM

## 2023-05-25 DIAGNOSIS — E118 Type 2 diabetes mellitus with unspecified complications: Secondary | ICD-10-CM

## 2023-05-25 DIAGNOSIS — N400 Enlarged prostate without lower urinary tract symptoms: Secondary | ICD-10-CM

## 2023-05-26 LAB — COMPREHENSIVE METABOLIC PANEL
ALT: 14 [IU]/L (ref 0–44)
AST: 11 [IU]/L (ref 0–40)
Albumin: 4.1 g/dL (ref 3.8–4.8)
Alkaline Phosphatase: 55 [IU]/L (ref 44–121)
BUN/Creatinine Ratio: 13 (ref 10–24)
BUN: 18 mg/dL (ref 8–27)
Bilirubin Total: 0.4 mg/dL (ref 0.0–1.2)
CO2: 23 mmol/L (ref 20–29)
Calcium: 8.9 mg/dL (ref 8.6–10.2)
Chloride: 102 mmol/L (ref 96–106)
Creatinine, Ser: 1.34 mg/dL — ABNORMAL HIGH (ref 0.76–1.27)
Globulin, Total: 2 g/dL (ref 1.5–4.5)
Glucose: 79 mg/dL (ref 70–99)
Potassium: 4.4 mmol/L (ref 3.5–5.2)
Sodium: 140 mmol/L (ref 134–144)
Total Protein: 6.1 g/dL (ref 6.0–8.5)
eGFR: 56 mL/min/{1.73_m2} — ABNORMAL LOW (ref >59)

## 2023-05-26 LAB — FRUCTOSAMINE: Fructosamine: 237 umol/L (ref 0–285)

## 2023-05-26 LAB — PSA: Prostate Specific Ag, Serum: 1.4 ng/mL (ref 0.0–4.0)

## 2023-06-01 ENCOUNTER — Ambulatory Visit (INDEPENDENT_AMBULATORY_CARE_PROVIDER_SITE_OTHER): Payer: Medicare Other | Admitting: Internal Medicine

## 2023-06-01 ENCOUNTER — Encounter: Payer: Self-pay | Admitting: Internal Medicine

## 2023-06-01 VITALS — BP 130/80 | HR 64 | Ht 66.0 in | Wt 253.6 lb

## 2023-06-01 DIAGNOSIS — E118 Type 2 diabetes mellitus with unspecified complications: Secondary | ICD-10-CM

## 2023-06-01 DIAGNOSIS — E119 Type 2 diabetes mellitus without complications: Secondary | ICD-10-CM

## 2023-06-01 DIAGNOSIS — N529 Male erectile dysfunction, unspecified: Secondary | ICD-10-CM

## 2023-06-01 DIAGNOSIS — E1121 Type 2 diabetes mellitus with diabetic nephropathy: Secondary | ICD-10-CM | POA: Diagnosis not present

## 2023-06-01 DIAGNOSIS — Z794 Long term (current) use of insulin: Secondary | ICD-10-CM

## 2023-06-01 DIAGNOSIS — I10 Essential (primary) hypertension: Secondary | ICD-10-CM

## 2023-06-01 DIAGNOSIS — Z0001 Encounter for general adult medical examination with abnormal findings: Secondary | ICD-10-CM

## 2023-06-01 DIAGNOSIS — Z1331 Encounter for screening for depression: Secondary | ICD-10-CM | POA: Diagnosis not present

## 2023-06-01 LAB — GLUCOSE, POCT (MANUAL RESULT ENTRY): POC Glucose: 84 mg/dL (ref 70–99)

## 2023-06-01 MED ORDER — MOUNJARO 12.5 MG/0.5ML ~~LOC~~ SOAJ: 12.5000 mg | SUBCUTANEOUS | 2 refills | Status: DC

## 2023-06-01 MED ORDER — IBUPROFEN 800 MG PO TABS: 800.0000 mg | ORAL_TABLET | Freq: Three times a day (TID) | ORAL | 0 refills | Status: DC | PRN

## 2023-06-01 MED ORDER — DAPAGLIFLOZIN PROPANEDIOL 10 MG PO TABS: 10.0000 mg | ORAL_TABLET | Freq: Every day | ORAL | 0 refills | Status: DC

## 2023-06-01 MED ORDER — TADALAFIL 20 MG PO TABS: 20.0000 mg | ORAL_TABLET | Freq: Every day | ORAL | Status: DC | PRN

## 2023-06-01 MED ORDER — IRBESARTAN-HYDROCHLOROTHIAZIDE 150-12.5 MG PO TABS: 1.0000 | ORAL_TABLET | Freq: Every day | ORAL | 0 refills | Status: DC

## 2023-06-01 MED ORDER — LANTUS SOLOSTAR 100 UNIT/ML ~~LOC~~ SOPN: 10.0000 [IU] | PEN_INJECTOR | Freq: Every day | SUBCUTANEOUS | 2 refills | Status: DC

## 2023-06-01 NOTE — Progress Notes (Signed)
Established Patient Office Visit  Subjective:  Patient ID: Jordan Santana, male    DOB: 03-31-1951  Age: 73 y.o. MRN: [id number]  Chief Complaint  Patient presents with  . Annual Exam    AWV    No new complaints, here for AWV refer to quality metrics and scanned documents.  Also here for lab review and medication refills. Labs reviewed and notable for well controlled diabetes, fructosamine at target. Denies any hypoglycemic episodes and home bg readings have been at target.       No other concerns at this time.   Past Medical History:  Diagnosis Date  . Diabetes mellitus without complication (HCC)   . Dysrhythmia   . Hypercholesteremia   . Hypertension     Past Surgical History:  Procedure Laterality Date  . ABSESS TONGUE    . COLONOSCOPY WITH PROPOFOL N/A 11/06/2017   Procedure: COLONOSCOPY WITH PROPOFOL;  Surgeon: Toledo, Boykin Nearing, MD;  Location: [facility];  Service: Gastroenterology;  Laterality: N/A;  . COLONOSCOPY WITH PROPOFOL N/A 02/04/2021   Procedure: COLONOSCOPY WITH PROPOFOL;  Surgeon: Regis Bill, MD;  Location: [facility];  Service: Endoscopy;  Laterality: N/A;  DM  . TUMOR ON FINGER     REMOVED    Social History   Socioeconomic History  . Marital status: Married    Spouse name: Not on file  . Number of children: Not on file  . Years of education: Not on file  . Highest education level: Not on file  Occupational History  . Not on file  Tobacco Use  . Smoking status: Never  . Smokeless tobacco: Never  Vaping Use  . Vaping status: Never Used  Substance and Sexual Activity  . Alcohol use: Yes    Comment: rarekt  . Drug use: Never  . Sexual activity: Not on file  Other Topics Concern  . Not on file  Social History Narrative  . Not on file   Social Drivers of Health   Financial Resource Strain: Not on file  Food Insecurity: No Food Insecurity (03/14/2022)   Hunger Vital Sign   . Worried About Programme researcher, broadcasting/film/video in the Last  Year: Never true   . Ran Out of Food in the Last Year: Never true  Transportation Needs: No Transportation Needs (03/14/2022)   PRAPARE - Transportation   . Lack of Transportation (Medical): No   . Lack of Transportation (Non-Medical): No  Physical Activity: Not on file  Stress: Not on file  Social Connections: Not on file  Intimate Partner Violence: Not At Risk (03/14/2022)   Humiliation, Afraid, Rape, and Kick questionnaire   . Fear of Current or Ex-Partner: No   . Emotionally Abused: No   . Physically Abused: No   . Sexually Abused: No    No family history on file.  Allergies  Allergen Reactions  . Augmentin [Amoxicillin-Pot Clavulanate]   . Bee Venom Hives    HONEY BEE  . Biaxin [Clarithromycin] Rash    Outpatient Medications Prior to Visit  Medication Sig  . Continuous Glucose Sensor (FREESTYLE LIBRE 3 PLUS SENSOR) MISC Change sensor every 15 days.  . Continuous Glucose Sensor (FREESTYLE LIBRE 3 SENSOR) MISC PLACE 1 SENSOR ON THE SKIN EVERY 14 DAYSAS DIRECTED. USE TO CHECK GLUCOSE CONTINUOUSLY.  Marland Kitchen ibuprofen (ADVIL) 800 MG tablet TAKE 1 TABLET BY MOUTH EVERY 8 HOURS AS NEEDED  . insulin aspart (NOVOLOG FLEXPEN) 100 UNIT/ML FlexPen Inject 0-9 Units into the skin 3 (three) times daily  with meals. Sliding scale CBG 70 - 120: 0 units CBG 121 - 150: 1 unit,  CBG 151 - 200: 2 units,  CBG 201 - 250: 3 units,  CBG 251 - 300: 5 units,  CBG 301 - 350: 7 units,  CBG 351 - 400: 9 units   CBG > 400: 9 units and notify your MD  . insulin glargine (LANTUS SOLOSTAR) 100 UNIT/ML Solostar Pen Inject 10 Units into the skin at bedtime.  . Insulin Pen Needle (PEN NEEDLES 3/16") 31G X 5 MM MISC Inject 0-9 Units into the skin 4 (four) times daily -  before meals and at bedtime.  . irbesartan-hydrochlorothiazide (AVALIDE) 150-12.5 MG tablet Take 1 tablet by mouth daily.  . tadalafil (CIALIS) 20 MG tablet Take 1 tablet (20 mg total) by mouth daily as needed for erectile dysfunction. Take 30 min to 4  hrs before intercourse  . tirzepatide (MOUNJARO) 12.5 MG/0.5ML Pen Inject 12.5 mg into the skin once a week.  . traMADol (ULTRAM) 50 MG tablet TAKE 1 TABLET BY MOUTH EVERY 6 HOURS AS NEEDED  . aspirin EC 81 MG tablet Take 81 mg by mouth daily. (Patient not taking: Reported on 09/29/2022)  . azithromycin (ZITHROMAX) 1 g powder Take 1 g by mouth once. (Patient not taking: Reported on 06/01/2023)   No facility-administered medications prior to visit.    Review of Systems  Constitutional:  Positive for weight loss (3 lbs).  HENT: Negative.    Eyes: Negative.   Respiratory: Negative.    Cardiovascular: Negative.   Gastrointestinal: Negative.   Genitourinary: Negative.   Skin: Negative.   Neurological: Negative.   Endo/Heme/Allergies: Negative.        Objective:   BP 130/80   Pulse 64   Ht 5\' 6"  (1.676 m)   Wt 253 lb 9.6 oz (115 kg)   SpO2 97%   BMI 40.93 kg/m   Vitals:   06/01/23 1045  BP: 130/80  Pulse: 64  Height: 5\' 6"  (1.676 m)  Weight: 253 lb 9.6 oz (115 kg)  SpO2: 97%  BMI (Calculated): 40.95    Physical Exam Vitals reviewed.  Constitutional:      Appearance: Normal appearance. He is obese.  HENT:     Head: Normocephalic.     Left Ear: There is no impacted cerumen.     Nose: Nose normal.     Mouth/Throat:     Mouth: Mucous membranes are moist.     Pharynx: No posterior oropharyngeal erythema.  Eyes:     Extraocular Movements: Extraocular movements intact.     Pupils: Pupils are equal, round, and reactive to light.  Cardiovascular:     Rate and Rhythm: Regular rhythm.     Chest Wall: PMI is not displaced.     Pulses: Normal pulses.     Heart sounds: Normal heart sounds. No murmur heard. Pulmonary:     Effort: Pulmonary effort is normal.     Breath sounds: Normal air entry. No rhonchi or rales.  Abdominal:     General: Abdomen is flat. Bowel sounds are normal. There is no distension.     Palpations: Abdomen is soft. There is no hepatomegaly,  splenomegaly or mass.     Tenderness: There is no abdominal tenderness.  Musculoskeletal:        General: Normal range of motion.     Cervical back: Normal range of motion and neck supple.     Right lower leg: No edema.     Left  lower leg: No edema.  Skin:    General: Skin is warm and dry.  Neurological:     General: No focal deficit present.     Mental Status: He is alert and oriented to person, place, and time.     Cranial Nerves: No cranial nerve deficit.     Motor: No weakness.  Psychiatric:        Mood and Affect: Mood normal.        Behavior: Behavior normal.     Results for orders placed or performed in visit on 06/01/23  POCT Glucose (CBG)  Result Value Ref Range   POC Glucose 84 70 - 99 mg/dl    Recent Results (from the past 2160 hours)  Hemoglobin A1c     Status: Abnormal   Collection Time: 03/08/23  8:33 AM  Result Value Ref Range   Hgb A1c MFr Bld 7.1 (H) 4.8 - 5.6 %    Comment:          Prediabetes: 5.7 - 6.4          Diabetes: >6.4          Glycemic control for adults with diabetes: <7.0    Est. average glucose Bld gHb Est-mCnc 157 mg/dL  TSH     Status: None   Collection Time: 03/08/23  8:33 AM  Result Value Ref Range   TSH 0.632 0.450 - 4.500 uIU/mL  CMP14+EGFR     Status: Abnormal   Collection Time: 03/08/23  8:33 AM  Result Value Ref Range   Glucose 110 (H) 70 - 99 mg/dL   BUN 20 8 - 27 mg/dL   Creatinine, Ser 4.01 (H) 0.76 - 1.27 mg/dL   eGFR 52 (L) >02 VO/ZDG/6.44   BUN/Creatinine Ratio 14 10 - 24   Sodium 141 134 - 144 mmol/L   Potassium 4.1 3.5 - 5.2 mmol/L   Chloride 102 96 - 106 mmol/L   CO2 23 20 - 29 mmol/L   Calcium 9.3 8.6 - 10.2 mg/dL   Total Protein 6.1 6.0 - 8.5 g/dL   Albumin 4.0 3.8 - 4.8 g/dL   Globulin, Total 2.1 1.5 - 4.5 g/dL   Bilirubin Total 0.6 0.0 - 1.2 mg/dL   Alkaline Phosphatase 56 44 - 121 IU/L   AST 12 0 - 40 IU/L   ALT 14 0 - 44 IU/L  Lipid panel     Status: Abnormal   Collection Time: 03/08/23  8:33 AM   Result Value Ref Range   Cholesterol, Total 197 100 - 199 mg/dL   Triglycerides 77 0 - 149 mg/dL   HDL 50 >03 mg/dL   VLDL Cholesterol Cal 14 5 - 40 mg/dL   LDL Chol Calc (NIH) 474 (H) 0 - 99 mg/dL   Chol/HDL Ratio 3.9 0.0 - 5.0 ratio    Comment:                                   T. Chol/HDL Ratio                                             Men  Women  1/2 Avg.Risk  3.4    3.3                                   Avg.Risk  5.0    4.4                                2X Avg.Risk  9.6    7.1                                3X Avg.Risk 23.4   11.0   POCT CBG (Fasting - Glucose)     Status: None   Collection Time: 03/16/23 11:18 AM  Result Value Ref Range   Glucose Fasting, POC 94 70 - 99 mg/dL  POC CREATINE & ALBUMIN,URINE     Status: None   Collection Time: 03/16/23  1:43 PM  Result Value Ref Range   Microalbumin Ur, POC 10 mg/L   Creatinine, POC 50 mg/dL   Albumin/Creatinine Ratio, Urine, POC <30   Comprehensive metabolic panel     Status: Abnormal   Collection Time: 05/25/23  9:57 AM  Result Value Ref Range   Glucose 79 70 - 99 mg/dL   BUN 18 8 - 27 mg/dL   Creatinine, Ser 6.21 (H) 0.76 - 1.27 mg/dL   eGFR 56 (L) >30 QM/VHQ/4.69   BUN/Creatinine Ratio 13 10 - 24   Sodium 140 134 - 144 mmol/L   Potassium 4.4 3.5 - 5.2 mmol/L   Chloride 102 96 - 106 mmol/L   CO2 23 20 - 29 mmol/L   Calcium 8.9 8.6 - 10.2 mg/dL   Total Protein 6.1 6.0 - 8.5 g/dL   Albumin 4.1 3.8 - 4.8 g/dL   Globulin, Total 2.0 1.5 - 4.5 g/dL   Bilirubin Total 0.4 0.0 - 1.2 mg/dL   Alkaline Phosphatase 55 44 - 121 IU/L   AST 11 0 - 40 IU/L   ALT 14 0 - 44 IU/L  PSA     Status: None   Collection Time: 05/25/23  9:57 AM  Result Value Ref Range   Prostate Specific Ag, Serum 1.4 0.0 - 4.0 ng/mL    Comment: Roche ECLIA methodology. According to the American Urological Association, Serum PSA should decrease and remain at undetectable levels after radical prostatectomy. The AUA  defines biochemical recurrence as an initial PSA value 0.2 ng/mL or greater followed by a subsequent confirmatory PSA value 0.2 ng/mL or greater. Values obtained with different assay methods or kits cannot be used interchangeably. Results cannot be interpreted as absolute evidence of the presence or absence of malignant disease.   Fructosamine     Status: None   Collection Time: 05/25/23  9:57 AM  Result Value Ref Range   Fructosamine 237 0 - 285 umol/L    Comment: Published reference interval for apparently healthy subjects between age 19 and 42 is 46 - 285 umol/L and in a poorly controlled diabetic population is 228 - 563 umol/L with a mean of 396 umol/L.   POCT Glucose (CBG)     Status: Normal   Collection Time: 06/01/23 10:55 AM  Result Value Ref Range   POC Glucose 84 70 - 99 mg/dl      Assessment & Plan:  As per problem list  Problem List Items Addressed This Visit  Endocrine   DM II (diabetes mellitus, type II), controlled (HCC) - Primary   Relevant Orders   POCT Glucose (CBG) (Completed)    Return in 3 months (on 08/30/2023) for fu with labs prior.   Total time spent: 30 minutes  Luna Fuse, MD  06/01/2023   This document may have been prepared by [facility] Voice Recognition software and as such may include unintentional dictation errors.

## 2023-06-06 ENCOUNTER — Ambulatory Visit (INDEPENDENT_AMBULATORY_CARE_PROVIDER_SITE_OTHER): Payer: Medicare Other | Admitting: Internal Medicine

## 2023-06-06 ENCOUNTER — Encounter: Payer: Self-pay | Admitting: Internal Medicine

## 2023-06-06 VITALS — BP 128/76 | HR 64 | Temp 97.4°F | Ht 66.0 in | Wt 256.8 lb

## 2023-06-06 DIAGNOSIS — M25562 Pain in left knee: Secondary | ICD-10-CM

## 2023-06-06 DIAGNOSIS — S8392XA Sprain of unspecified site of left knee, initial encounter: Secondary | ICD-10-CM

## 2023-06-06 DIAGNOSIS — Z0130 Encounter for examination of blood pressure without abnormal findings: Secondary | ICD-10-CM

## 2023-06-06 MED ORDER — DICLOFENAC SODIUM 1 % EX GEL: 4.0000 g | Freq: Three times a day (TID) | CUTANEOUS | 0 refills | Status: AC

## 2023-06-06 MED ORDER — KNEE BRACE/HINGED BARS LARGE MISC: 1.0000 [IU] | Freq: Every day | 0 refills | Status: AC

## 2023-06-06 NOTE — Progress Notes (Addendum)
 Established Patient Office Visit  Subjective:  Patient ID: Jordan Santana, male    DOB: [birth year]  Age: 73 y.o. MRN: [id number]  No chief complaint on file.   C/o left knee pain exacerbated by standing and lateral movement x 3 days and started when he arose from a chair. Previous h/o sports injury to that knee in his youth. Severity 4-5/10 relieved by tylenol.  Addendum: Currently on Lantus insulin daily with sliding scale insulin.    No other concerns at this time.   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     REMOVED    Social History   Socioeconomic History   Marital status: Married    Spouse name: Not on file   Number of children: Not on file   Years of education: Not on file   Highest education level: Not on file  Occupational History   Not on file  Tobacco Use   Smoking status: Never   Smokeless tobacco: Never  Vaping Use   Vaping status: Never Used  Substance and Sexual Activity   Alcohol use: Yes    Comment: rarekt   Drug use: Never   Sexual activity: Not on file  Other Topics Concern   Not on file  Social History Narrative   Not on file   Social Drivers of Health   Financial Resource Strain: Not on file  Food Insecurity: No Food Insecurity (03/14/2022)   Hunger Vital Sign    Worried About Running Out of Food in the Last Year: Never true    Ran Out of Food in the Last Year: Never true   Transportation Needs: No Transportation Needs (03/14/2022)   PRAPARE - Administrator, Civil Service (Medical): No    Lack of Transportation (Non-Medical): No  Physical Activity: Not on file  Stress: Not on file  Social Connections: Not on file  Intimate Partner Violence: Not At Risk (03/14/2022)   Humiliation, Afraid, Rape, and Kick questionnaire    Fear of Current or Ex-Partner: No    Emotionally Abused: No    Physically Abused: No    Sexually Abused: No    No family history on file.  Allergies  Allergen Reactions   Augmentin [Amoxicillin-Pot Clavulanate]    Bee Venom Hives    HONEY BEE   Biaxin [Clarithromycin] Rash    Outpatient Medications Prior to Visit  Medication Sig   Continuous Glucose Sensor (FREESTYLE LIBRE 3 PLUS SENSOR) MISC Change sensor every 15 days.   Continuous Glucose Sensor (FREESTYLE LIBRE 3 SENSOR) MISC PLACE 1 SENSOR ON THE SKIN EVERY 14 DAYSAS DIRECTED. USE TO CHECK GLUCOSE CONTINUOUSLY.   dapagliflozin propanediol (FARXIGA) 10 MG TABS tablet Take 1 tablet (10 mg total) by mouth daily before breakfast.   ibuprofen (ADVIL) 800 MG tablet Take 1 tablet (800 mg total) by mouth every 8 (eight) hours as needed.   insulin aspart (NOVOLOG FLEXPEN)  100 UNIT/ML FlexPen Inject 0-9 Units into the skin 3 (three) times daily with meals. Sliding scale CBG 70 - 120: 0 units CBG 121 - 150: 1 unit,  CBG 151 - 200: 2 units,  CBG 201 - 250: 3 units,  CBG 251 - 300: 5 units,  CBG 301 - 350: 7 units,  CBG 351 - 400: 9 units   CBG > 400: 9 units and notify your MD   insulin glargine (LANTUS SOLOSTAR) 100 UNIT/ML Solostar Pen Inject 10 Units into the skin at bedtime.   Insulin Pen Needle (PEN NEEDLES 3/16") 31G X 5 MM MISC Inject 0-9 Units into the skin 4 (four) times daily -  before meals and at bedtime.   irbesartan-hydrochlorothiazide (AVALIDE) 150-12.5 MG tablet Take 1 tablet by mouth daily.   tadalafil (CIALIS) 20 MG tablet Take 1 tablet (20 mg total) by mouth  daily as needed for erectile dysfunction. Take 30 min to 4 hrs before intercourse   tirzepatide (MOUNJARO) 12.5 MG/0.5ML Pen Inject 12.5 mg into the skin once a week.   traMADol (ULTRAM) 50 MG tablet TAKE 1 TABLET BY MOUTH EVERY 6 HOURS AS NEEDED   aspirin EC 81 MG tablet Take 81 mg by mouth daily. (Patient not taking: Reported on 06/06/2023)   No facility-administered medications prior to visit.    Review of Systems  Musculoskeletal:  Positive for joint pain.  All other systems reviewed and are negative.      Objective:   BP 128/76   Pulse 64   Temp (!) 97.4 F (36.3 C)   Ht 5\' 6"  (1.676 m)   Wt 256 lb 12.8 oz (116.5 kg)   SpO2 97%   BMI 41.45 kg/m   Vitals:   06/06/23 1003  BP: 128/76  Pulse: 64  Temp: (!) 97.4 F (36.3 C)  Height: 5\' 6"  (1.676 m)  Weight: 256 lb 12.8 oz (116.5 kg)  SpO2: 97%  BMI (Calculated): 41.47    Physical Exam Vitals reviewed.  Constitutional:      Appearance: Normal appearance. He is obese.  HENT:     Head: Normocephalic.     Left Ear: There is no impacted cerumen.     Nose: Nose normal.     Mouth/Throat:     Mouth: Mucous membranes are moist.     Pharynx: No posterior oropharyngeal erythema.  Eyes:     Extraocular Movements: Extraocular movements intact.     Pupils: Pupils are equal, round, and reactive to light.  Cardiovascular:     Rate and Rhythm: Regular rhythm.     Chest Wall: PMI is not displaced.     Pulses: Normal pulses.     Heart sounds: Normal heart sounds. No murmur heard. Pulmonary:     Effort: Pulmonary effort is normal.     Breath sounds: Normal air entry. No rhonchi or rales.  Abdominal:     General: Abdomen is flat. Bowel sounds are normal. There is no distension.     Palpations: Abdomen is soft. There is no hepatomegaly, splenomegaly or mass.     Tenderness: There is no abdominal tenderness.  Musculoskeletal:        General: Normal range of motion.     Cervical back: Normal range of motion and neck supple.      Left knee: Swelling present. No effusion, bony tenderness or crepitus. No LCL laxity or MCL laxity.    Instability Tests: Anterior drawer test negative.     Right lower leg: No edema.  Left lower leg: No edema.  Skin:    General: Skin is warm and dry.  Neurological:     General: No focal deficit present.     Mental Status: He is alert and oriented to person, place, and time.     Cranial Nerves: No cranial nerve deficit.     Motor: No weakness.  Psychiatric:        Mood and Affect: Mood normal.        Behavior: Behavior normal.      No results found for any visits on 06/06/23.  Recent Results (from the past 2160 hours)  POCT CBG (Fasting - Glucose)     Status: None   Collection Time: 03/16/23 11:18 AM  Result Value Ref Range   Glucose Fasting, POC 94 70 - 99 mg/dL  POC CREATINE & ALBUMIN,URINE     Status: None   Collection Time: 03/16/23  1:43 PM  Result Value Ref Range   Microalbumin Ur, POC 10 mg/L   Creatinine, POC 50 mg/dL   Albumin/Creatinine Ratio, Urine, POC <30   Comprehensive metabolic panel     Status: Abnormal   Collection Time: 05/25/23  9:57 AM  Result Value Ref Range   Glucose 79 70 - 99 mg/dL   BUN 18 8 - 27 mg/dL   Creatinine, Ser 9.56 (H) 0.76 - 1.27 mg/dL   eGFR 56 (L) >21 HY/QMV/7.84   BUN/Creatinine Ratio 13 10 - 24   Sodium 140 134 - 144 mmol/L   Potassium 4.4 3.5 - 5.2 mmol/L   Chloride 102 96 - 106 mmol/L   CO2 23 20 - 29 mmol/L   Calcium 8.9 8.6 - 10.2 mg/dL   Total Protein 6.1 6.0 - 8.5 g/dL   Albumin 4.1 3.8 - 4.8 g/dL   Globulin, Total 2.0 1.5 - 4.5 g/dL   Bilirubin Total 0.4 0.0 - 1.2 mg/dL   Alkaline Phosphatase 55 44 - 121 IU/L   AST 11 0 - 40 IU/L   ALT 14 0 - 44 IU/L  PSA     Status: None   Collection Time: 05/25/23  9:57 AM  Result Value Ref Range   Prostate Specific Ag, Serum 1.4 0.0 - 4.0 ng/mL    Comment: Roche ECLIA methodology. According to the American Urological Association, Serum PSA should decrease and remain at  undetectable levels after radical prostatectomy. The AUA defines biochemical recurrence as an initial PSA value 0.2 ng/mL or greater followed by a subsequent confirmatory PSA value 0.2 ng/mL or greater. Values obtained with different assay methods or kits cannot be used interchangeably. Results cannot be interpreted as absolute evidence of the presence or absence of malignant disease.   Fructosamine     Status: None   Collection Time: 05/25/23  9:57 AM  Result Value Ref Range   Fructosamine 237 0 - 285 umol/L    Comment: Published reference interval for apparently healthy subjects between age 18 and 32 is 25 - 285 umol/L and in a poorly controlled diabetic population is 228 - 563 umol/L with a mean of 396 umol/L.   POCT Glucose (CBG)     Status: Normal   Collection Time: 06/01/23 10:55 AM  Result Value Ref Range   POC Glucose 84 70 - 99 mg/dl      Assessment & Plan:  As per problem list  Problem List Items Addressed This Visit   None Visit Diagnoses       Sprain of left knee, unspecified ligament, initial encounter    -  Primary   Relevant Medications   Elastic Bandages & Supports (KNEE BRACE/HINGED BARS LARGE) MISC   diclofenac Sodium (VOLTAREN) 1 % GEL       Return if symptoms worsen or fail to improve.   Total time spent: 30 minutes  Luna Fuse, MD  06/06/2023   This document may have been prepared by [facility] Voice Recognition software and as such may include unintentional dictation errors.

## 2023-06-15 ENCOUNTER — Telehealth: Payer: Self-pay | Admitting: Internal Medicine

## 2023-06-15 ENCOUNTER — Other Ambulatory Visit: Payer: Self-pay | Admitting: Internal Medicine

## 2023-06-15 DIAGNOSIS — E118 Type 2 diabetes mellitus with unspecified complications: Secondary | ICD-10-CM

## 2023-06-15 NOTE — Telephone Encounter (Signed)
Pt would like to let you know that [address] has [facility] & would like to get his sent there He also wanted to let you know that [facility] contacting you about his freestyle libre 3

## 2023-06-18 ENCOUNTER — Other Ambulatory Visit: Payer: Self-pay

## 2023-06-18 DIAGNOSIS — E118 Type 2 diabetes mellitus with unspecified complications: Secondary | ICD-10-CM

## 2023-06-18 MED ORDER — MOUNJARO 12.5 MG/0.5ML ~~LOC~~ SOAJ: 12.5000 mg | SUBCUTANEOUS | 2 refills | Status: DC

## 2023-06-18 NOTE — Telephone Encounter (Signed)
Rx has been reissued to this [facility]

## 2023-06-27 ENCOUNTER — Telehealth: Payer: Self-pay | Admitting: Internal Medicine

## 2023-06-27 DIAGNOSIS — S8392XA Sprain of unspecified site of left knee, initial encounter: Secondary | ICD-10-CM

## 2023-06-27 NOTE — Telephone Encounter (Signed)
Patient left VM that his knee is still hurting him and he would like to get further imaging done, possibly an MRI. Would like something done this week. Please advise on next steps.

## 2023-06-28 ENCOUNTER — Other Ambulatory Visit: Payer: Self-pay

## 2023-06-28 MED ORDER — MELOXICAM 15 MG PO TABS: 15.0000 mg | ORAL_TABLET | Freq: Every day | ORAL | 0 refills | Status: AC

## 2023-06-28 MED ORDER — MELOXICAM 15 MG PO TABS: 15.0000 mg | ORAL_TABLET | Freq: Every day | ORAL | 0 refills | Status: DC

## 2023-06-28 NOTE — Telephone Encounter (Signed)
Patient informed, rx sent and medlist updated

## 2023-07-10 ENCOUNTER — Telehealth: Payer: Self-pay

## 2023-07-10 NOTE — Telephone Encounter (Signed)
 This message is from [facility] via [facility], can you please amend the last office note to state the following.    "The patient's insurance requires patients to be on insulin and will not allow [place] to use the medication list or assessment/plan to obtain this information. The notes received did not have any insulin use listed outside of these sections. , recent (within the last 6 months) clinical notes that have this information clearly stated or have the provider make an addendum to the notes as long as it is clearly indicated as such with an updated signature and date? The insurance requires this information to be present tense or current/active use. They will not accept anything in past or future tense, or anything that is being prescribed or recommended for the patient to do. They must see current/active use."

## 2023-07-10 NOTE — Telephone Encounter (Signed)
 Faxed to [facility]

## 2023-09-07 ENCOUNTER — Other Ambulatory Visit

## 2023-09-07 DIAGNOSIS — I10 Essential (primary) hypertension: Secondary | ICD-10-CM

## 2023-09-07 DIAGNOSIS — E782 Mixed hyperlipidemia: Secondary | ICD-10-CM

## 2023-09-07 DIAGNOSIS — E118 Type 2 diabetes mellitus with unspecified complications: Secondary | ICD-10-CM

## 2023-09-08 LAB — CMP14+EGFR
ALT: 20 IU/L (ref 0–44)
AST: 18 IU/L (ref 0–40)
Albumin: 4.3 g/dL (ref 3.8–4.8)
Alkaline Phosphatase: 50 IU/L (ref 44–121)
BUN/Creatinine Ratio: 11 (ref 10–24)
BUN: 15 mg/dL (ref 8–27)
Bilirubin Total: 0.7 mg/dL (ref 0.0–1.2)
CO2: 23 mmol/L (ref 20–29)
Calcium: 9.2 mg/dL (ref 8.6–10.2)
Chloride: 101 mmol/L (ref 96–106)
Creatinine, Ser: 1.32 mg/dL — ABNORMAL HIGH (ref 0.76–1.27)
Globulin, Total: 2.1 g/dL (ref 1.5–4.5)
Glucose: 85 mg/dL (ref 70–99)
Potassium: 3.9 mmol/L (ref 3.5–5.2)
Sodium: 140 mmol/L (ref 134–144)
Total Protein: 6.4 g/dL (ref 6.0–8.5)
eGFR: 57 mL/min/{1.73_m2} — ABNORMAL LOW (ref >59)

## 2023-09-08 LAB — TSH: TSH: 0.87 u[IU]/mL (ref 0.450–4.500)

## 2023-09-08 LAB — LIPID PANEL
Chol/HDL Ratio: 3.9 ratio (ref 0.0–5.0)
Cholesterol, Total: 182 mg/dL (ref 100–199)
HDL: 47 mg/dL (ref >39)
LDL Chol Calc (NIH): 119 mg/dL — ABNORMAL HIGH (ref 0–99)
Triglycerides: 86 mg/dL (ref 0–149)
VLDL Cholesterol Cal: 16 mg/dL (ref 5–40)

## 2023-09-08 LAB — HEMOGLOBIN A1C
Est. average glucose Bld gHb Est-mCnc: 146 mg/dL
Hgb A1c MFr Bld: 6.7 % — ABNORMAL HIGH (ref 4.8–5.6)

## 2023-09-14 ENCOUNTER — Other Ambulatory Visit: Payer: Self-pay | Admitting: Internal Medicine

## 2023-09-14 ENCOUNTER — Ambulatory Visit (INDEPENDENT_AMBULATORY_CARE_PROVIDER_SITE_OTHER): Payer: Medicare Other | Admitting: Internal Medicine

## 2023-09-14 ENCOUNTER — Encounter: Payer: Self-pay | Admitting: Internal Medicine

## 2023-09-14 VITALS — BP 115/70 | HR 63 | Temp 96.4°F | Ht 66.0 in | Wt 246.4 lb

## 2023-09-14 DIAGNOSIS — Z794 Long term (current) use of insulin: Secondary | ICD-10-CM

## 2023-09-14 DIAGNOSIS — E782 Mixed hyperlipidemia: Secondary | ICD-10-CM

## 2023-09-14 DIAGNOSIS — E118 Type 2 diabetes mellitus with unspecified complications: Secondary | ICD-10-CM

## 2023-09-14 DIAGNOSIS — N529 Male erectile dysfunction, unspecified: Secondary | ICD-10-CM | POA: Diagnosis not present

## 2023-09-14 DIAGNOSIS — E119 Type 2 diabetes mellitus without complications: Secondary | ICD-10-CM

## 2023-09-14 DIAGNOSIS — I10 Essential (primary) hypertension: Secondary | ICD-10-CM

## 2023-09-14 DIAGNOSIS — N1831 Chronic kidney disease, stage 3a: Secondary | ICD-10-CM

## 2023-09-14 LAB — GLUCOSE, POCT (MANUAL RESULT ENTRY): POC Glucose: 91 mg/dL (ref 70–99)

## 2023-09-14 MED ORDER — DAPAGLIFLOZIN PROPANEDIOL 10 MG PO TABS: 10.0000 mg | ORAL_TABLET | Freq: Every day | ORAL | 0 refills | Status: DC

## 2023-09-14 MED ORDER — LANTUS SOLOSTAR 100 UNIT/ML ~~LOC~~ SOPN
6.0000 [IU] | PEN_INJECTOR | Freq: Every day | SUBCUTANEOUS | 3 refills | Status: DC
Start: 2023-09-14 — End: 2023-09-21

## 2023-09-14 MED ORDER — FREESTYLE LIBRE 3 PLUS SENSOR MISC: 2 refills | Status: DC

## 2023-09-14 MED ORDER — NEXLETOL 180 MG PO TABS: 1.0000 | ORAL_TABLET | Freq: Every day | ORAL | 1 refills | Status: DC

## 2023-09-14 MED ORDER — MOUNJARO 15 MG/0.5ML ~~LOC~~ SOAJ: 15.0000 mg | SUBCUTANEOUS | 2 refills | Status: DC

## 2023-09-14 MED ORDER — IRBESARTAN-HYDROCHLOROTHIAZIDE 150-12.5 MG PO TABS: 1.0000 | ORAL_TABLET | Freq: Every day | ORAL | 0 refills | Status: DC

## 2023-09-14 MED ORDER — TADALAFIL 20 MG PO TABS: 20.0000 mg | ORAL_TABLET | Freq: Every day | ORAL | Status: DC | PRN

## 2023-09-14 NOTE — Progress Notes (Signed)
 Established Patient Office Visit  Subjective:  Patient ID: Jordan Santana, male    DOB: Oct 14, 1950  Age: 73 y.o. MRN: [id number]  Chief Complaint  Patient presents with   Follow-up    3 month follow up    No new complaints, here for lab review and medication refills. Labs reviewed and notable for well controlled diabetes, A1c now at target, LDL not at target with cmp notable for stable CKD. Denies any hypoglycemic episodes and home bg readings have been at target.     No other concerns at this time.   Past Medical History:  Diagnosis Date   Diabetes mellitus without complication (HCC)    Dysrhythmia    Hypercholesteremia    Hypertension     Past Surgical History:  Procedure Laterality Date   ABSESS TONGUE     COLONOSCOPY WITH PROPOFOL  N/A 11/06/2017   Procedure: COLONOSCOPY WITH PROPOFOL ;  Surgeon: Toledo, Alphonsus Jeans, MD;  Location: [facility] ENDOSCOPY;  Service: Gastroenterology;  Laterality: N/A;   COLONOSCOPY WITH PROPOFOL  N/A 02/04/2021   Procedure: COLONOSCOPY WITH PROPOFOL ;  Surgeon: Shane Darling, MD;  Location: [facility] ENDOSCOPY;  Service: Endoscopy;  Laterality: N/A;  DM   TUMOR ON FINGER     REMOVED    Social History   Socioeconomic History   Marital status: Married    Spouse name: Not on file   Number of children: Not on file   Years of education: Not on file   Highest education level: Not on file  Occupational History   Not on file  Tobacco Use   Smoking status: Never   Smokeless tobacco: Never  Vaping Use   Vaping status: Never Used  Substance and Sexual Activity   Alcohol use: Yes    Comment: rarekt   Drug use: Never   Sexual activity: Not on file  Other Topics Concern   Not on file  Social History Narrative   Not on file   Social Drivers of Health   Financial Resource Strain: Not on file  Food Insecurity: No Food Insecurity (03/14/2022)   Hunger Vital Sign    Worried About Running Out of Food in the Last Year: Never true    Ran Out of Food  in the Last Year: Never true  Transportation Needs: No Transportation Needs (03/14/2022)   PRAPARE - Administrator, Civil Service (Medical): No    Lack of Transportation (Non-Medical): No  Physical Activity: Not on file  Stress: Not on file  Social Connections: Not on file  Intimate Partner Violence: Not At Risk (03/14/2022)   Humiliation, Afraid, Rape, and Kick questionnaire    Fear of Current or Ex-Partner: No    Emotionally Abused: No    Physically Abused: No    Sexually Abused: No    No family history on file.  Allergies  Allergen Reactions   Augmentin [Amoxicillin-Pot Clavulanate]    Bee Venom Hives    HONEY BEE   Biaxin [Clarithromycin] Rash    Outpatient Medications Prior to Visit  Medication Sig Note   insulin  aspart (NOVOLOG  FLEXPEN) 100 UNIT/ML FlexPen Inject 0-9 Units into the skin 3 (three) times daily with meals. Sliding scale CBG 70 - 120: 0 units CBG 121 - 150: 1 unit,  CBG 151 - 200: 2 units,  CBG 201 - 250: 3 units,  CBG 251 - 300: 5 units,  CBG 301 - 350: 7 units,  CBG 351 - 400: 9 units   CBG > 400: 9 units  and notify your MD    Insulin  Pen Needle (PEN NEEDLES 3/16") 31G X 5 MM MISC Inject 0-9 Units into the skin 4 (four) times daily -  before meals and at bedtime.    traMADol  (ULTRAM ) 50 MG tablet TAKE 1 TABLET BY MOUTH EVERY 6 HOURS AS NEEDED 09/14/2023: Needs refill at [facility].   [DISCONTINUED] Continuous Glucose Sensor (FREESTYLE LIBRE 3 PLUS SENSOR) MISC Change sensor every 15 days.    [DISCONTINUED] Continuous Glucose Sensor (FREESTYLE LIBRE 3 SENSOR) MISC PLACE 1 SENSOR ON THE SKIN EVERY 14 DAYSAS DIRECTED. USE TO CHECK GLUCOSE CONTINUOUSLY.    [DISCONTINUED] insulin  glargine (LANTUS  SOLOSTAR) 100 UNIT/ML Solostar Pen Inject 10 Units into the skin at bedtime.    [DISCONTINUED] irbesartan -hydrochlorothiazide  (AVALIDE) 150-12.5 MG tablet Take 1 tablet by mouth daily. 09/14/2023: Needs refill at [facility]   [DISCONTINUED] tadalafil  (CIALIS ) 20 MG  tablet Take 1 tablet (20 mg total) by mouth daily as needed for erectile dysfunction. Take 30 min to 4 hrs before intercourse 09/14/2023: Can he get stronger dose?   [DISCONTINUED] tirzepatide  (MOUNJARO ) 12.5 MG/0.5ML Pen Inject 12.5 mg into the skin once a week. 09/14/2023: Can he go up to 16?   [DISCONTINUED] aspirin  EC 81 MG tablet Take 81 mg by mouth daily. (Patient not taking: Reported on 06/06/2023)    No facility-administered medications prior to visit.    Review of Systems  Constitutional:  Positive for weight loss (10 lbs).  HENT: Negative.    Eyes: Negative.   Respiratory: Negative.    Cardiovascular: Negative.   Gastrointestinal: Negative.   Genitourinary: Negative.   Skin: Negative.   Neurological: Negative.   Endo/Heme/Allergies: Negative.        Objective:   BP 115/70   Pulse 63   Temp (!) 96.4 F (35.8 C)   Ht 5\' 6"  (1.676 m)   Wt 246 lb 6.4 oz (111.8 kg)   SpO2 97%   BMI 39.77 kg/m   Vitals:   09/14/23 0927  BP: 115/70  Pulse: 63  Temp: (!) 96.4 F (35.8 C)  Height: 5\' 6"  (1.676 m)  Weight: 246 lb 6.4 oz (111.8 kg)  SpO2: 97%  BMI (Calculated): 39.79    Physical Exam   Results for orders placed or performed in visit on 09/14/23  POCT Glucose (CBG)  Result Value Ref Range   POC Glucose 91 70 - 99 mg/dl    Recent Results (from the past 2160 hours)  Hemoglobin A1c     Status: Abnormal   Collection Time: 09/07/23  3:58 PM  Result Value Ref Range   Hgb A1c MFr Bld 6.7 (H) 4.8 - 5.6 %    Comment:          Prediabetes: 5.7 - 6.4          Diabetes: >6.4          Glycemic control for adults with diabetes: <7.0    Est. average glucose Bld gHb Est-mCnc 146 mg/dL  TSH     Status: None   Collection Time: 09/07/23  3:58 PM  Result Value Ref Range   TSH 0.870 0.450 - 4.500 uIU/mL  CMP14+EGFR     Status: Abnormal   Collection Time: 09/07/23  3:58 PM  Result Value Ref Range   Glucose 85 70 - 99 mg/dL   BUN 15 8 - 27 mg/dL   Creatinine, Ser 1.61 (H)  0.76 - 1.27 mg/dL   eGFR 57 (L) >09 UE/AVW/0.98   BUN/Creatinine Ratio 11 10 - 24  Sodium 140 134 - 144 mmol/L   Potassium 3.9 3.5 - 5.2 mmol/L   Chloride 101 96 - 106 mmol/L   CO2 23 20 - 29 mmol/L   Calcium 9.2 8.6 - 10.2 mg/dL   Total Protein 6.4 6.0 - 8.5 g/dL   Albumin 4.3 3.8 - 4.8 g/dL   Globulin, Total 2.1 1.5 - 4.5 g/dL   Bilirubin Total 0.7 0.0 - 1.2 mg/dL   Alkaline Phosphatase 50 44 - 121 IU/L   AST 18 0 - 40 IU/L   ALT 20 0 - 44 IU/L  Lipid panel     Status: Abnormal   Collection Time: 09/07/23  3:58 PM  Result Value Ref Range   Cholesterol, Total 182 100 - 199 mg/dL   Triglycerides 86 0 - 149 mg/dL   HDL 47 >29 mg/dL   VLDL Cholesterol Cal 16 5 - 40 mg/dL   LDL Chol Calc (NIH) 528 (H) 0 - 99 mg/dL   Chol/HDL Ratio 3.9 0.0 - 5.0 ratio    Comment:                                   T. Chol/HDL Ratio                                             Men  Women                               1/2 Avg.Risk  3.4    3.3                                   Avg.Risk  5.0    4.4                                2X Avg.Risk  9.6    7.1                                3X Avg.Risk 23.4   11.0   POCT Glucose (CBG)     Status: Normal   Collection Time: 09/14/23  9:46 AM  Result Value Ref Range   POC Glucose 91 70 - 99 mg/dl      Assessment & Plan:   Problem List Items Addressed This Visit       Cardiovascular and Mediastinum   Essential hypertension   Relevant Medications   irbesartan -hydrochlorothiazide  (AVALIDE) 150-12.5 MG tablet   tadalafil  (CIALIS ) 20 MG tablet   Bempedoic Acid (NEXLETOL) 180 MG TABS     Endocrine   DM II (diabetes mellitus, type II), controlled (HCC) - Primary   Relevant Medications   tirzepatide  (MOUNJARO ) 15 MG/0.5ML Pen   insulin  glargine (LANTUS  SOLOSTAR) 100 UNIT/ML Solostar Pen   irbesartan -hydrochlorothiazide  (AVALIDE) 150-12.5 MG tablet   Continuous Glucose Sensor (FREESTYLE LIBRE 3 PLUS SENSOR) MISC   dapagliflozin  propanediol (FARXIGA ) 10 MG  TABS tablet   Other Relevant Orders   POCT Glucose (CBG) (Completed)   Hemoglobin A1c   Other Visit Diagnoses       ED (erectile dysfunction)  of organic origin       Relevant Medications   tadalafil  (CIALIS ) 20 MG tablet     CKD stage 3a, GFR 45-59 ml/min (HCC)       Relevant Orders   Lipid panel     Mixed hyperlipidemia       Relevant Medications   irbesartan -hydrochlorothiazide  (AVALIDE) 150-12.5 MG tablet   tadalafil  (CIALIS ) 20 MG tablet   Bempedoic Acid (NEXLETOL) 180 MG TABS   Other Relevant Orders   Comprehensive metabolic panel with GFR       Return in about 3 months (around 12/15/2023) for fu with labs prior.   Total time spent: 20 minutes  Arzella Bitters, MD  09/14/2023   This document may have been prepared by [facility] Voice Recognition software and as such may include unintentional dictation errors.

## 2023-09-17 ENCOUNTER — Other Ambulatory Visit: Payer: Self-pay | Admitting: Internal Medicine

## 2023-09-17 DIAGNOSIS — E118 Type 2 diabetes mellitus with unspecified complications: Secondary | ICD-10-CM

## 2023-09-21 ENCOUNTER — Other Ambulatory Visit: Payer: Self-pay

## 2023-09-21 DIAGNOSIS — E782 Mixed hyperlipidemia: Secondary | ICD-10-CM

## 2023-09-21 DIAGNOSIS — N529 Male erectile dysfunction, unspecified: Secondary | ICD-10-CM

## 2023-09-21 DIAGNOSIS — E118 Type 2 diabetes mellitus with unspecified complications: Secondary | ICD-10-CM

## 2023-09-21 DIAGNOSIS — E119 Type 2 diabetes mellitus without complications: Secondary | ICD-10-CM

## 2023-09-21 DIAGNOSIS — I10 Essential (primary) hypertension: Secondary | ICD-10-CM

## 2023-09-21 MED ORDER — IRBESARTAN-HYDROCHLOROTHIAZIDE 150-12.5 MG PO TABS: 1.0000 | ORAL_TABLET | Freq: Every day | ORAL | 0 refills | Status: DC

## 2023-09-21 MED ORDER — FREESTYLE LIBRE 3 PLUS SENSOR MISC: 2 refills | Status: DC

## 2023-09-21 MED ORDER — NEXLETOL 180 MG PO TABS: 1.0000 | ORAL_TABLET | Freq: Every day | ORAL | 1 refills | Status: AC

## 2023-09-21 MED ORDER — LANTUS SOLOSTAR 100 UNIT/ML ~~LOC~~ SOPN: 6.0000 [IU] | PEN_INJECTOR | Freq: Every day | SUBCUTANEOUS | 3 refills | Status: DC

## 2023-09-21 MED ORDER — DAPAGLIFLOZIN PROPANEDIOL 10 MG PO TABS: 10.0000 mg | ORAL_TABLET | Freq: Every day | ORAL | 0 refills | Status: DC

## 2023-09-21 MED ORDER — TADALAFIL 20 MG PO TABS: 20.0000 mg | ORAL_TABLET | Freq: Every day | ORAL | Status: DC | PRN

## 2023-09-21 MED ORDER — MOUNJARO 15 MG/0.5ML ~~LOC~~ SOAJ: 15.0000 mg | SUBCUTANEOUS | 2 refills | Status: DC

## 2023-11-07 ENCOUNTER — Other Ambulatory Visit: Payer: Self-pay | Admitting: Internal Medicine

## 2023-12-18 ENCOUNTER — Other Ambulatory Visit: Payer: Self-pay | Admitting: Internal Medicine

## 2023-12-18 DIAGNOSIS — I10 Essential (primary) hypertension: Secondary | ICD-10-CM

## 2023-12-21 ENCOUNTER — Ambulatory Visit: Admitting: Internal Medicine

## 2023-12-31 ENCOUNTER — Ambulatory Visit: Admitting: Internal Medicine

## 2024-01-15 ENCOUNTER — Other Ambulatory Visit

## 2024-01-15 ENCOUNTER — Other Ambulatory Visit: Payer: Self-pay | Admitting: Internal Medicine

## 2024-01-15 DIAGNOSIS — E118 Type 2 diabetes mellitus with unspecified complications: Secondary | ICD-10-CM

## 2024-01-15 DIAGNOSIS — N1831 Chronic kidney disease, stage 3a: Secondary | ICD-10-CM

## 2024-01-15 DIAGNOSIS — E782 Mixed hyperlipidemia: Secondary | ICD-10-CM

## 2024-01-16 LAB — LIPID PANEL
Chol/HDL Ratio: 4 ratio (ref 0.0–5.0)
Cholesterol, Total: 195 mg/dL (ref 100–199)
HDL: 49 mg/dL (ref >39)
LDL Chol Calc (NIH): 127 mg/dL — ABNORMAL HIGH (ref 0–99)
Triglycerides: 107 mg/dL (ref 0–149)
VLDL Cholesterol Cal: 19 mg/dL (ref 5–40)

## 2024-01-16 LAB — COMPREHENSIVE METABOLIC PANEL WITH GFR
ALT: 15 IU/L (ref 0–44)
AST: 12 IU/L (ref 0–40)
Albumin: 4 g/dL (ref 3.8–4.8)
Alkaline Phosphatase: 52 IU/L (ref 44–121)
BUN/Creatinine Ratio: 11 (ref 10–24)
BUN: 14 mg/dL (ref 8–27)
Bilirubin Total: 0.4 mg/dL (ref 0.0–1.2)
CO2: 21 mmol/L (ref 20–29)
Calcium: 9.2 mg/dL (ref 8.6–10.2)
Chloride: 104 mmol/L (ref 96–106)
Creatinine, Ser: 1.31 mg/dL — ABNORMAL HIGH (ref 0.76–1.27)
Globulin, Total: 1.9 g/dL (ref 1.5–4.5)
Glucose: 98 mg/dL (ref 70–99)
Potassium: 4.1 mmol/L (ref 3.5–5.2)
Sodium: 139 mmol/L (ref 134–144)
Total Protein: 5.9 g/dL — ABNORMAL LOW (ref 6.0–8.5)
eGFR: 57 mL/min/1.73 — ABNORMAL LOW (ref >59)

## 2024-01-16 LAB — HEMOGLOBIN A1C
Est. average glucose Bld gHb Est-mCnc: 140 mg/dL
Hgb A1c MFr Bld: 6.5 % — ABNORMAL HIGH (ref 4.8–5.6)

## 2024-01-18 ENCOUNTER — Encounter: Payer: Self-pay | Admitting: Internal Medicine

## 2024-01-18 ENCOUNTER — Ambulatory Visit: Payer: Self-pay | Admitting: Internal Medicine

## 2024-01-18 ENCOUNTER — Ambulatory Visit (INDEPENDENT_AMBULATORY_CARE_PROVIDER_SITE_OTHER): Admitting: Internal Medicine

## 2024-01-18 VITALS — BP 120/64 | HR 54 | Temp 96.7°F | Ht 66.0 in | Wt 245.0 lb

## 2024-01-18 DIAGNOSIS — N529 Male erectile dysfunction, unspecified: Secondary | ICD-10-CM | POA: Diagnosis not present

## 2024-01-18 DIAGNOSIS — E118 Type 2 diabetes mellitus with unspecified complications: Secondary | ICD-10-CM | POA: Diagnosis not present

## 2024-01-18 DIAGNOSIS — I10 Essential (primary) hypertension: Secondary | ICD-10-CM

## 2024-01-18 DIAGNOSIS — E782 Mixed hyperlipidemia: Secondary | ICD-10-CM

## 2024-01-18 DIAGNOSIS — N1831 Chronic kidney disease, stage 3a: Secondary | ICD-10-CM

## 2024-01-18 LAB — POCT CBG (FASTING - GLUCOSE)-MANUAL ENTRY: Glucose Fasting, POC: 91 mg/dL (ref 70–99)

## 2024-01-18 MED ORDER — MOUNJARO 15 MG/0.5ML ~~LOC~~ SOAJ: 15.0000 mg | SUBCUTANEOUS | 2 refills | Status: AC

## 2024-01-18 MED ORDER — INSULIN PEN NEEDLE 31G X 4 MM MISC: 1.0000 | Freq: Four times a day (QID) | 2 refills | Status: AC

## 2024-01-18 MED ORDER — DAPAGLIFLOZIN PROPANEDIOL 10 MG PO TABS: 10.0000 mg | ORAL_TABLET | Freq: Every day | ORAL | 0 refills | Status: DC

## 2024-01-18 MED ORDER — TADALAFIL 20 MG PO TABS: 20.0000 mg | ORAL_TABLET | Freq: Every day | ORAL | Status: AC | PRN

## 2024-01-18 MED ORDER — FREESTYLE LIBRE 3 PLUS SENSOR MISC: 2 refills | Status: AC

## 2024-01-18 NOTE — Progress Notes (Signed)
 Established Patient Office Visit  Subjective:  Patient ID: Jordan Santana, male    DOB: [birth year]  Age: 73 y.o. MRN: [id number]  No chief complaint on file.   No new complaints, here for lab review and medication refills. Labs reviewed and notable for well controlled diabetes, A1c improved and remains at target, lipids not at target with cmp notable for stable ckd. Denies any hypoglycemic episodes and home bg readings have been at target.     No other concerns at this time.   Past Medical History:  Diagnosis Date   Diabetes mellitus without complication (HCC)    Dysrhythmia    Hypercholesteremia    Hypertension     Past Surgical History:  Procedure Laterality Date   ABSESS TONGUE     COLONOSCOPY WITH PROPOFOL  N/A 11/06/2017   Procedure: COLONOSCOPY WITH PROPOFOL ;  Surgeon: Toledo, Ladell POUR, MD;  Location: [facility] END[facility]COPY;  Service: Gastroenterology;  Laterality: N/A;   COLONOSCOPY WITH PROPOFOL  N/A 02/04/2021   Procedure: COLONOSCOPY WITH PROPOFOL ;  Surgeon: Maryruth Ole DASEN, MD;  Location: [facility] ENDOSCOPY;  Service: Endoscopy;  Laterality: N/A;  DM   TUMOR ON FINGER     REMOVED    Social History   Socioeconomic History   Marital status: Married    Spouse name: Not on file   Number of children: Not on file   Years of education: Not on file   Highest education level: Not on file  Occupational History   Not on file  Tobacco Use   Smoking status: Never   Smokeless tobacco: Never  Vaping Use   Vaping status: Never Used  Substance and Sexual Activity   Alcohol use: Yes    Comment: rarekt   Drug use: Never   Sexual activity: Not on file  Other Topics Concern   Not on file  Social History Narrative   Not on file   Social Drivers of Health   Financial Resource Strain: Not on file  Food Insecurity: No Food Insecurity (03/14/2022)   Hunger Vital Sign    Worried About Running Out of Food in the Last Year: Never true    Ran Out of Food in the Last Year: Never true   Transportation Needs: No Transportation Needs (03/14/2022)   PRAPARE - Administrator, Civil Service (Medical): No    Lack of Transportation (Non-Medical): No  Physical Activity: Not on file  Stress: Not on file  Social Connections: Not on file  Intimate Partner Violence: Not At Risk (03/14/2022)   Humiliation, Afraid, Rape, and Kick questionnaire    Fear of Current or Ex-Partner: No    Emotionally Abused: No    Physically Abused: No    Sexually Abused: No    No family history on file.  Allergies  Allergen Reactions   Augmentin [Amoxicillin-Pot Clavulanate]    Bee Venom Hives    HONEY BEE   Biaxin [Clarithromycin] Rash    Outpatient Medications Prior to Visit  Medication Sig   Bempedoic Acid  (NEXLETOL ) 180 MG TABS Take 1 tablet (180 mg total) by mouth daily in the afternoon.   ibuprofen  (ADVIL ) 800 MG tablet TAKE 1 TABLET BY MOUTH EVERY 8 HOURS AS NEEDED   insulin  aspart (NOVOLOG  FLEXPEN) 100 UNIT/ML FlexPen Inject 0-9 Units into the skin 3 (three) times daily with meals. Sliding scale CBG 70 - 120: 0 units CBG 121 - 150: 1 unit,  CBG 151 - 200: 2 units,  CBG 201 - 250: 3 units,  CBG  251 - 300: 5 units,  CBG 301 - 350: 7 units,  CBG 351 - 400: 9 units   CBG > 400: 9 units and notify your MD   irbesartan -hydrochlorothiazide  (AVALIDE) 150-12.5 MG tablet TAKE 1 TABLET BY MOUTH EVERY DAY   traMADol  (ULTRAM ) 50 MG tablet TAKE 1 TABLET BY MOUTH EVERY 6 HOURS AS NEEDED   [DISCONTINUED] Continuous Glucose Sensor (FREESTYLE LIBRE 3 PLUS SENSOR) MISC Change sensor every 15 days.   [DISCONTINUED] dapagliflozin  propanediol (FARXIGA ) 10 MG TABS tablet Take 1 tablet (10 mg total) by mouth daily before breakfast.   [DISCONTINUED] insulin  glargine (LANTUS  SOLOSTAR) 100 UNIT/ML Solostar Pen Inject 6 Units into the skin at bedtime.   [DISCONTINUED] Insulin  Pen Needle (PEN NEEDLES 3/16) 31G X 5 MM MISC Inject 0-9 Units into the skin 4 (four) times daily -  before meals and at bedtime.    [DISCONTINUED] tadalafil  (CIALIS ) 20 MG tablet Take 1 tablet (20 mg total) by mouth daily as needed for erectile dysfunction. Take 30 min to 4 hrs before intercourse   [DISCONTINUED] tirzepatide  (MOUNJARO ) 15 MG/0.5ML Pen ADMINISTER 15 MG UNDER THE SKIN 1 TIME A WEEK   No facility-administered medications prior to visit.    Review of Systems  Constitutional:  Positive for weight loss (1 lb).  HENT: Negative.    Eyes: Negative.   Respiratory: Negative.    Cardiovascular: Negative.   Gastrointestinal: Negative.   Genitourinary: Negative.   Skin: Negative.   Neurological: Negative.   Endo/Heme/Allergies: Negative.        Objective:   BP 120/64   Pulse (!) 54   Temp (!) 96.7 F (35.9 C) (Tympanic)   Ht 5' 6 (1.676 m)   Wt 245 lb (111.1 kg)   SpO2 98%   BMI 39.54 kg/m   Vitals:   01/18/24 1044  BP: 120/64  Pulse: (!) 54  Temp: (!) 96.7 F (35.9 C)  Height: 5' 6 (1.676 m)  Weight: 245 lb (111.1 kg)  SpO2: 98%  TempSrc: Tympanic  BMI (Calculated): 39.56    Physical Exam Vitals reviewed.  Constitutional:      Appearance: Normal appearance. He is obese.  HENT:     Head: Normocephalic.     Left Ear: There is no impacted cerumen.     Nose: Nose normal.     Mouth/Throat:     Mouth: Mucous membranes are moist.     Pharynx: No posterior oropharyngeal erythema.  Eyes:     Extraocular Movements: Extraocular movements intact.     Pupils: Pupils are equal, round, and reactive to light.  Cardiovascular:     Rate and Rhythm: Regular rhythm.     Chest Wall: PMI is not displaced.     Pulses: Normal pulses.     Heart sounds: Normal heart sounds. No murmur heard. Pulmonary:     Effort: Pulmonary effort is normal.     Breath sounds: Normal air entry. No rhonchi or rales.  Abdominal:     General: Abdomen is flat. Bowel sounds are normal. There is no distension.     Palpations: Abdomen is soft. There is no hepatomegaly, splenomegaly or mass.     Tenderness: There is  no abdominal tenderness.  Musculoskeletal:        General: Normal range of motion.     Cervical back: Normal range of motion and neck supple.     Left knee: Swelling present. No effusion, bony tenderness or crepitus. No LCL laxity or MCL laxity.  Instability Tests: Anterior drawer test negative.     Right lower leg: No edema.     Left lower leg: No edema.  Skin:    General: Skin is warm and dry.  Neurological:     General: No focal deficit present.     Mental Status: He is alert and oriented to person, place, and time.     Cranial Nerves: No cranial nerve deficit.     Motor: No weakness.  Psychiatric:        Mood and Affect: Mood normal.        Behavior: Behavior normal.      Results for orders placed or performed in visit on 01/18/24  POCT CBG (Fasting - Glucose)  Result Value Ref Range   Glucose Fasting, POC 91 70 - 99 mg/dL    Recent Results (from the past 2160 hours)  Comprehensive metabolic panel with GFR     Status: Abnormal   Collection Time: 01/15/24  8:43 AM  Result Value Ref Range   Glucose 98 70 - 99 mg/dL   BUN 14 8 - 27 mg/dL   Creatinine, Ser 8.68 (H) 0.76 - 1.27 mg/dL   eGFR 57 (L) >40 fO/fpw/8.26   BUN/Creatinine Ratio 11 10 - 24   Sodium 139 134 - 144 mmol/L   Potassium 4.1 3.5 - 5.2 mmol/L   Chloride 104 96 - 106 mmol/L   CO2 21 20 - 29 mmol/L   Calcium 9.2 8.6 - 10.2 mg/dL   Total Protein 5.9 (L) 6.0 - 8.5 g/dL   Albumin 4.0 3.8 - 4.8 g/dL   Globulin, Total 1.9 1.5 - 4.5 g/dL   Bilirubin Total 0.4 0.0 - 1.2 mg/dL   Alkaline Phosphatase 52 44 - 121 IU/L    Comment: **Effective January 28, 2024 Alkaline Phosphatase**   reference interval will be changing to:              Age                Male          Male           0 -  5 days         47 - 127       47 - 127           6 - 10 days         29 - 242       29 - 242          11 - 20 days        109 - 357      109 - 357          21 - 30 days         94 - 494       94 - 494           1 -  2  months      149 - 539      149 - 539           3 -  6 months      131 - 452      131 - 452           7 - 11 months      117 - 401      117 - 401   12 months -  6 years       158 -  369      158 - 369           7 - 12 years       150 - 409      150 - 409               13 years       156 - 435       78 - 227               14 years       114 - 375       64 - 161               15 years        88 - 279       56 - 134               16 years        74 - 207       51 - 121               17 years        63 - 161       47 - 113          18 - 20 years        51 - 125       42 - 106          21 - 50 years         47 - 123       41 - 116          51 - 80 years        49 - 135       51 - 125              >80 years        48 - 129       48 - 129    AST 12 0 - 40 IU/L   ALT 15 0 - 44 IU/L  Lipid panel     Status: Abnormal   Collection Time: 01/15/24  8:43 AM  Result Value Ref Range   Cholesterol, Total 195 100 - 199 mg/dL   Triglycerides 892 0 - 149 mg/dL   HDL 49 >60 mg/dL   VLDL Cholesterol Cal 19 5 - 40 mg/dL   LDL Chol Calc (NIH) 872 (H) 0 - 99 mg/dL   Chol/HDL Ratio 4.0 0.0 - 5.0 ratio    Comment:                                   T. Chol/HDL Ratio                                             Men  Women                               1/2 Avg.Risk  3.4    3.3                                   Avg.Risk  5.0  4.4                                2X Avg.Risk  9.6    7.1                                3X Avg.Risk 23.4   11.0   Hemoglobin A1c     Status: Abnormal   Collection Time: 01/15/24  8:43 AM  Result Value Ref Range   Hgb A1c MFr Bld 6.5 (H) 4.8 - 5.6 %    Comment:          Prediabetes: 5.7 - 6.4          Diabetes: >6.4          Glycemic control for adults with diabetes: <7.0    Est. average glucose Bld gHb Est-mCnc 140 mg/dL  POCT CBG (Fasting - Glucose)     Status: Normal   Collection Time: 01/18/24 10:49 AM  Result Value Ref Range   Glucose Fasting, POC 91 70 - 99 mg/dL       Assessment & Plan:  Controlled type 2 diabetes mellitus with complication, without long-term current use of insulin  (HCC) -     POCT CBG (Fasting - Glucose) -     Dapagliflozin  Propanediol; Take 1 tablet (10 mg total) by mouth daily before breakfast.  Dispense: 90 tablet; Refill: 0 -     FreeStyle Libre 3 Plus Sensor; Change sensor every 15 days.  Dispense: 2 each; Refill: 2 -     Mounjaro ; Inject 15 mg into the skin once a week.  Dispense: 2 mL; Refill: 2 -     Insulin  Pen Needle; 1 Needle by Does not apply route in the morning, at noon, in the evening, and at bedtime.  Dispense: 200 each; Refill: 2 -     Hemoglobin A1c  ED (erectile dysfunction) of organic origin -     Tadalafil ; Take 1 tablet (20 mg total) by mouth daily as needed for erectile dysfunction. Take 30 min to 4 hrs before intercourse  Dispense: 15 tablet; Refill: 02  Essential hypertension  Mixed hyperlipidemia -     Comprehensive metabolic panel with GFR  CKD stage 3a, GFR 45-59 ml/min (HCC) -     Lipid panel    Problem List Items Addressed This Visit       Cardiovascular and Mediastinum   Essential hypertension   Relevant Medications   tadalafil  (CIALIS ) 20 MG tablet     Endocrine   DM II (diabetes mellitus, type II), controlled (HCC) - Primary   Relevant Medications   dapagliflozin  propanediol (FARXIGA ) 10 MG TABS tablet   Continuous Glucose Sensor (FREESTYLE LIBRE 3 PLUS SENSOR) MISC   tirzepatide  (MOUNJARO ) 15 MG/0.5ML Pen   Insulin  Pen Needle 31G X 4 MM MISC   Other Relevant Orders   POCT CBG (Fasting - Glucose) (Completed)   Other Visit Diagnoses       ED (erectile dysfunction) of organic origin       Relevant Medications   tadalafil  (CIALIS ) 20 MG tablet     Mixed hyperlipidemia       Relevant Medications   tadalafil  (CIALIS ) 20 MG tablet     CKD stage 3a, GFR 45-59 ml/min (HCC)           Return in about 3 months (around 04/18/2024) for fu with  labs prior.   Total time spent: 20  minutes  Sherrill Cinderella Perry, MD  01/18/2024   This document may have been prepared by [facility] Voice Recognition software and as such may include unintentional dictation errors.

## 2024-04-19 ENCOUNTER — Other Ambulatory Visit: Payer: Self-pay | Admitting: Internal Medicine

## 2024-04-19 DIAGNOSIS — E118 Type 2 diabetes mellitus with unspecified complications: Secondary | ICD-10-CM

## 2024-04-25 ENCOUNTER — Other Ambulatory Visit

## 2024-04-25 ENCOUNTER — Ambulatory Visit: Admitting: Internal Medicine

## 2024-04-25 DIAGNOSIS — E118 Type 2 diabetes mellitus with unspecified complications: Secondary | ICD-10-CM

## 2024-04-25 DIAGNOSIS — E782 Mixed hyperlipidemia: Secondary | ICD-10-CM

## 2024-04-25 DIAGNOSIS — N1831 Chronic kidney disease, stage 3a: Secondary | ICD-10-CM

## 2024-04-26 LAB — COMPREHENSIVE METABOLIC PANEL WITH GFR
ALT: 13 IU/L (ref 0–44)
AST: 14 IU/L (ref 0–40)
Albumin: 4.1 g/dL (ref 3.8–4.8)
Alkaline Phosphatase: 65 IU/L (ref 47–123)
BUN/Creatinine Ratio: 9 — ABNORMAL LOW (ref 10–24)
BUN: 13 mg/dL (ref 8–27)
Bilirubin Total: 0.5 mg/dL (ref 0.0–1.2)
CO2: 25 mmol/L (ref 20–29)
Calcium: 9.4 mg/dL (ref 8.6–10.2)
Chloride: 101 mmol/L (ref 96–106)
Creatinine, Ser: 1.43 mg/dL — ABNORMAL HIGH (ref 0.76–1.27)
Globulin, Total: 2.2 g/dL (ref 1.5–4.5)
Glucose: 93 mg/dL (ref 70–99)
Potassium: 4.6 mmol/L (ref 3.5–5.2)
Sodium: 141 mmol/L (ref 134–144)
Total Protein: 6.3 g/dL (ref 6.0–8.5)
eGFR: 52 mL/min/1.73 — ABNORMAL LOW (ref >59)

## 2024-04-26 LAB — LIPID PANEL
Chol/HDL Ratio: 4.4 ratio (ref 0.0–5.0)
Cholesterol, Total: 218 mg/dL — ABNORMAL HIGH (ref 100–199)
HDL: 49 mg/dL (ref >39)
LDL Chol Calc (NIH): 149 mg/dL — ABNORMAL HIGH (ref 0–99)
Triglycerides: 114 mg/dL (ref 0–149)
VLDL Cholesterol Cal: 20 mg/dL (ref 5–40)

## 2024-04-26 LAB — HEMOGLOBIN A1C
Est. average glucose Bld gHb Est-mCnc: 140 mg/dL
Hgb A1c MFr Bld: 6.5 % — ABNORMAL HIGH (ref 4.8–5.6)

## 2024-05-02 ENCOUNTER — Ambulatory Visit: Payer: Self-pay | Admitting: Internal Medicine

## 2024-05-02 ENCOUNTER — Ambulatory Visit: Admitting: Internal Medicine

## 2024-05-02 VITALS — BP 110/74 | HR 78 | Temp 97.8°F | Ht 66.0 in | Wt 227.6 lb

## 2024-05-02 DIAGNOSIS — I10 Essential (primary) hypertension: Secondary | ICD-10-CM | POA: Diagnosis not present

## 2024-05-02 DIAGNOSIS — N529 Male erectile dysfunction, unspecified: Secondary | ICD-10-CM | POA: Diagnosis not present

## 2024-05-02 DIAGNOSIS — E118 Type 2 diabetes mellitus with unspecified complications: Secondary | ICD-10-CM | POA: Diagnosis not present

## 2024-05-02 DIAGNOSIS — N1831 Chronic kidney disease, stage 3a: Secondary | ICD-10-CM | POA: Diagnosis not present

## 2024-05-02 DIAGNOSIS — E782 Mixed hyperlipidemia: Secondary | ICD-10-CM

## 2024-05-02 LAB — POCT CBG (FASTING - GLUCOSE)-MANUAL ENTRY: Glucose Fasting, POC: 94 mg/dL (ref 70–99)

## 2024-05-02 MED ORDER — TADALAFIL 20 MG PO TABS: 20.0000 mg | ORAL_TABLET | Freq: Every day | ORAL | Status: AC | PRN

## 2024-05-02 MED ORDER — MOUNJARO 15 MG/0.5ML ~~LOC~~ SOAJ: 15.0000 mg | SUBCUTANEOUS | 2 refills | Status: AC

## 2024-05-02 MED ORDER — NEXLETOL 180 MG PO TABS: 1.0000 | ORAL_TABLET | Freq: Every day | ORAL | 1 refills | Status: AC

## 2024-05-02 MED ORDER — IRBESARTAN-HYDROCHLOROTHIAZIDE 150-12.5 MG PO TABS: 1.0000 | ORAL_TABLET | Freq: Every day | ORAL | 0 refills | Status: AC

## 2024-05-02 NOTE — Progress Notes (Signed)
 "  Established Patient Office Visit  Subjective:  Patient ID: Jordan Santana, male    DOB: 07-26-1950  Age: 73 y.o. MRN: [id number]  Chief Complaint  Patient presents with   Follow-up    3 month lab results    No new complaints, here for lab review and medication refills. Labs reviewed and notable for well controlled diabetes, A1c at target, lipids elevated with cmp notable for stable ckd. Unable to fill Nexletol  due to insurance denial.Denies any hypoglycemic episodes and home bg readings have been at target.     No other concerns at this time.   Past Medical History:  Diagnosis Date   Diabetes mellitus without complication (HCC)    Dysrhythmia    Hypercholesteremia    Hypertension     Past Surgical History:  Procedure Laterality Date   ABSESS TONGUE     COLONOSCOPY WITH PROPOFOL  N/A 11/06/2017   Procedure: COLONOSCOPY WITH PROPOFOL ;  Surgeon: Toledo, Ladell POUR, MD;  Location: [facility];  Service: Gastroenterology;  Laterality: N/A;   COLONOSCOPY WITH PROPOFOL  N/A 02/04/2021   Procedure: COLONOSCOPY WITH PROPOFOL ;  Surgeon: Maryruth Ole DASEN, MD;  Location: [facility];  Service: Endoscopy;  Laterality: N/A;  DM   TUMOR ON FINGER     REMOVED    Social History   Socioeconomic History   Marital status: Married    Spouse name: Not on file   Number of children: Not on file   Years of education: Not on file   Highest education level: Not on file  Occupational History   Not on file  Tobacco Use   Smoking status: Never   Smokeless tobacco: Never  Vaping Use   Vaping status: Never Used  Substance and Sexual Activity   Alcohol use: Yes    Comment: rarekt   Drug use: Never   Sexual activity: Not on file  Other Topics Concern   Not on file  Social History Narrative   Not on file   Social Drivers of Health   Tobacco Use: Low Risk (01/18/2024)   Patient History    Smoking Tobacco Use: Never    Smokeless Tobacco Use: Never    Passive Exposure: Not on file   Financial Resource Strain: Not on file  Food Insecurity: No Food Insecurity (03/14/2022)   Hunger Vital Sign    Worried About Running Out of Food in the Last Year: Never true    Ran Out of Food in the Last Year: Never true  Transportation Needs: No Transportation Needs (03/14/2022)   PRAPARE - Administrator, Civil Service (Medical): No    Lack of Transportation (Non-Medical): No  Physical Activity: Not on file  Stress: Not on file  Social Connections: Not on file  Intimate Partner Violence: Not At Risk (03/14/2022)   Humiliation, Afraid, Rape, and Kick questionnaire    Fear of Current or Ex-Partner: No    Emotionally Abused: No    Physically Abused: No    Sexually Abused: No  Depression (PHQ2-9): Low Risk (06/04/2023)   Depression (PHQ2-9)    PHQ-2 Score: 1  Alcohol Screen: Not on file  Housing: Low Risk (03/14/2022)   Housing    Last Housing Risk Score: 0  Utilities: Not At Risk (03/14/2022)   [employer] Utilities    Threatened with loss of utilities: No  Health Literacy: Not on file    No family history on file.  Allergies[1]  Show/hide medication list[2]  Review of Systems  Constitutional:  Positive for  weight loss (18 lbs). Negative for malaise/fatigue.  HENT: Negative.    Eyes: Negative.   Respiratory: Negative.    Cardiovascular: Negative.   Gastrointestinal: Negative.   Genitourinary: Negative.   Skin: Negative.   Neurological: Negative.   Endo/Heme/Allergies: Negative.        Objective:   BP 110/74   Pulse 78   Temp 97.8 F (36.6 C)   Ht 5' 6 (1.676 m)   Wt 227 lb 9.6 oz (103.2 kg)   SpO2 98%   BMI 36.74 kg/m   Vitals:   05/02/24 1040  BP: 110/74  Pulse: 78  Temp: 97.8 F (36.6 C)  Height: 5' 6 (1.676 m)  Weight: 227 lb 9.6 oz (103.2 kg)  SpO2: 98%  BMI (Calculated): 36.75    Physical Exam Vitals reviewed.  Constitutional:      Appearance: Normal appearance. He is obese.  HENT:     Head: Normocephalic.     Left Ear:  There is no impacted cerumen.     Nose: Nose normal.     Mouth/Throat:     Mouth: Mucous membranes are moist.     Pharynx: No posterior oropharyngeal erythema.  Eyes:     Extraocular Movements: Extraocular movements intact.     Pupils: Pupils are equal, round, and reactive to light.  Cardiovascular:     Rate and Rhythm: Regular rhythm.     Chest Wall: PMI is not displaced.     Pulses: Normal pulses.     Heart sounds: Normal heart sounds. No murmur heard. Pulmonary:     Effort: Pulmonary effort is normal.     Breath sounds: Normal air entry. No rhonchi or rales.  Abdominal:     General: Abdomen is flat. Bowel sounds are normal. There is no distension.     Palpations: Abdomen is soft. There is no hepatomegaly, splenomegaly or mass.     Tenderness: There is no abdominal tenderness.  Musculoskeletal:        General: Normal range of motion.     Cervical back: Normal range of motion and neck supple.     Left knee: Swelling present. No effusion, bony tenderness or crepitus. No LCL laxity or MCL laxity.    Instability Tests: Anterior drawer test negative.     Right lower leg: No edema.     Left lower leg: No edema.  Skin:    General: Skin is warm and dry.  Neurological:     General: No focal deficit present.     Mental Status: He is alert and oriented to person, place, and time.     Cranial Nerves: No cranial nerve deficit.     Motor: No weakness.  Psychiatric:        Mood and Affect: Mood normal.        Behavior: Behavior normal.      Results for orders placed or performed in visit on 05/02/24  POCT CBG (Fasting - Glucose)  Result Value Ref Range   Glucose Fasting, POC 94 70 - 99 mg/dL    Recent Results (from the past 2160 hours)  Comprehensive metabolic panel with GFR     Status: Abnormal   Collection Time: 04/25/24  9:52 AM  Result Value Ref Range   Glucose 93 70 - 99 mg/dL   BUN 13 8 - 27 mg/dL   Creatinine, Ser 8.56 (H) 0.76 - 1.27 mg/dL   eGFR 52 (L) >40  fO/fpw/8.26   BUN/Creatinine Ratio 9 (L) 10 - 24  Sodium 141 134 - 144 mmol/L   Potassium 4.6 3.5 - 5.2 mmol/L   Chloride 101 96 - 106 mmol/L   CO2 25 20 - 29 mmol/L   Calcium 9.4 8.6 - 10.2 mg/dL   Total Protein 6.3 6.0 - 8.5 g/dL   Albumin 4.1 3.8 - 4.8 g/dL   Globulin, Total 2.2 1.5 - 4.5 g/dL   Bilirubin Total 0.5 0.0 - 1.2 mg/dL   Alkaline Phosphatase 65 47 - 123 IU/L   AST 14 0 - 40 IU/L   ALT 13 0 - 44 IU/L  Lipid panel     Status: Abnormal   Collection Time: 04/25/24  9:52 AM  Result Value Ref Range   Cholesterol, Total 218 (H) 100 - 199 mg/dL   Triglycerides 885 0 - 149 mg/dL   HDL 49 >60 mg/dL   VLDL Cholesterol Cal 20 5 - 40 mg/dL   LDL Chol Calc (NIH) 850 (H) 0 - 99 mg/dL   Chol/HDL Ratio 4.4 0.0 - 5.0 ratio    Comment:                                   T. Chol/HDL Ratio                                             Men  Women                               1/2 Avg.Risk  3.4    3.3                                   Avg.Risk  5.0    4.4                                2X Avg.Risk  9.6    7.1                                3X Avg.Risk 23.4   11.0   Hemoglobin A1c     Status: Abnormal   Collection Time: 04/25/24  9:52 AM  Result Value Ref Range   Hgb A1c MFr Bld 6.5 (H) 4.8 - 5.6 %    Comment:          Prediabetes: 5.7 - 6.4          Diabetes: >6.4          Glycemic control for adults with diabetes: <7.0    Est. average glucose Bld gHb Est-mCnc 140 mg/dL  POCT CBG (Fasting - Glucose)     Status: Normal   Collection Time: 05/02/24 10:47 AM  Result Value Ref Range   Glucose Fasting, POC 94 70 - 99 mg/dL      Assessment & Plan:  Cesario was seen today for follow-up.  Controlled type 2 diabetes mellitus with complication, without long-term current use of insulin  (HCC) -     POCT CBG (Fasting - Glucose) -     Hemoglobin A1c  Mixed hyperlipidemia -     Nexletol ; Take 1  tablet (180 mg total) by mouth daily in the afternoon.  Dispense: 90 tablet; Refill: 1 -      Comprehensive metabolic panel with GFR  CKD stage 3a, GFR 45-59 ml/min (HCC) -     Lipid panel   Due to rhabdomyolysis with statins he cannot take insurance recommended statin alternatives. Dc nsaids. Problem List Items Addressed This Visit   None Visit Diagnoses       Controlled type 2 diabetes mellitus with complication, without long-term current use of insulin  (HCC)    -  Primary   Relevant Orders   POCT CBG (Fasting - Glucose) (Completed)     Mixed hyperlipidemia       Relevant Medications   Bempedoic Acid  (NEXLETOL ) 180 MG TABS     CKD stage 3a, GFR 45-59 ml/min (HCC)           Return in about 3 months (around 07/31/2024) for fu with labs prior.   Total time spent: 20 minutes. This time includes review of previous notes and results and patient face to face interaction during today'Laquiesha Piacente visit.    Sherrill Cinderella Perry, MD  05/02/2024   This document may have been prepared by [facility] Voice Recognition software and as such may include unintentional dictation errors.     [1]  Allergies Allergen Reactions   Augmentin [Amoxicillin-Pot Clavulanate]    Bee Venom Hives    HONEY BEE   Biaxin [Clarithromycin] Rash  [2]  Outpatient Medications Prior to Visit  Medication Sig   Continuous Glucose Sensor (FREESTYLE LIBRE 3 PLUS SENSOR) MISC Change sensor every 15 days.   FARXIGA  10 MG TABS tablet TAKE 1 TABLET(10 MG) BY MOUTH DAILY BEFORE BREAKFAST   ibuprofen  (ADVIL ) 800 MG tablet TAKE 1 TABLET BY MOUTH EVERY 8 HOURS AS NEEDED   insulin  aspart (NOVOLOG  FLEXPEN) 100 UNIT/ML FlexPen Inject 0-9 Units into the skin 3 (three) times daily with meals. Sliding scale CBG 70 - 120: 0 units CBG 121 - 150: 1 unit,  CBG 151 - 200: 2 units,  CBG 201 - 250: 3 units,  CBG 251 - 300: 5 units,  CBG 301 - 350: 7 units,  CBG 351 - 400: 9 units   CBG > 400: 9 units and notify your MD   Insulin  Pen Needle 31G X 4 MM MISC 1 Needle by Does not apply route in the morning, at noon, in the evening, and at  bedtime.   irbesartan -hydrochlorothiazide  (AVALIDE) 150-12.5 MG tablet TAKE 1 TABLET BY MOUTH EVERY DAY   tadalafil  (CIALIS ) 20 MG tablet Take 1 tablet (20 mg total) by mouth daily as needed for erectile dysfunction. Take 30 min to 4 hrs before intercourse   tirzepatide  (MOUNJARO ) 15 MG/0.5ML Pen Inject 15 mg into the skin once a week.   traMADol  (ULTRAM ) 50 MG tablet TAKE 1 TABLET BY MOUTH EVERY 6 HOURS AS NEEDED   [DISCONTINUED] Bempedoic Acid  (NEXLETOL ) 180 MG TABS Take 1 tablet (180 mg total) by mouth daily in the afternoon. (Patient not taking: Reported on 05/02/2024)   No facility-administered medications prior to visit.   "

## 2024-05-03 ENCOUNTER — Other Ambulatory Visit: Payer: Self-pay | Admitting: Internal Medicine

## 2024-05-03 DIAGNOSIS — I10 Essential (primary) hypertension: Secondary | ICD-10-CM

## 2024-05-21 ENCOUNTER — Telehealth: Payer: Self-pay | Admitting: Internal Medicine

## 2024-05-21 NOTE — Telephone Encounter (Signed)
 Patient left VM requesting his lab results. Please advise.

## 2024-06-10 ENCOUNTER — Telehealth: Payer: Self-pay | Admitting: Internal Medicine

## 2024-06-10 NOTE — Telephone Encounter (Signed)
 Entered in error

## 2024-08-01 ENCOUNTER — Ambulatory Visit: Admitting: Internal Medicine
